# Patient Record
Sex: Female | Born: 1945 | Race: White | Hispanic: No | Smoking: Never smoker
Health system: Southern US, Community
[De-identification: ages and names within clinical notes are randomized; demographics above are authoritative.]

## PROBLEM LIST (undated history)

## (undated) DIAGNOSIS — M8580 Other specified disorders of bone density and structure, unspecified site: Secondary | ICD-10-CM

## (undated) DIAGNOSIS — C801 Malignant (primary) neoplasm, unspecified: Secondary | ICD-10-CM

## (undated) DIAGNOSIS — F32A Depression, unspecified: Secondary | ICD-10-CM

## (undated) DIAGNOSIS — E042 Nontoxic multinodular goiter: Secondary | ICD-10-CM

## (undated) DIAGNOSIS — K219 Gastro-esophageal reflux disease without esophagitis: Secondary | ICD-10-CM

## (undated) DIAGNOSIS — I10 Essential (primary) hypertension: Secondary | ICD-10-CM

## (undated) DIAGNOSIS — J40 Bronchitis, not specified as acute or chronic: Secondary | ICD-10-CM

## (undated) DIAGNOSIS — E7800 Pure hypercholesterolemia, unspecified: Secondary | ICD-10-CM

## (undated) DIAGNOSIS — F329 Major depressive disorder, single episode, unspecified: Secondary | ICD-10-CM

## (undated) DIAGNOSIS — IMO0002 Reserved for concepts with insufficient information to code with codable children: Secondary | ICD-10-CM

## (undated) DIAGNOSIS — Z9889 Other specified postprocedural states: Secondary | ICD-10-CM

## (undated) DIAGNOSIS — E049 Nontoxic goiter, unspecified: Secondary | ICD-10-CM

## (undated) DIAGNOSIS — R112 Nausea with vomiting, unspecified: Secondary | ICD-10-CM

## (undated) DIAGNOSIS — F419 Anxiety disorder, unspecified: Secondary | ICD-10-CM

## (undated) HISTORY — PX: APPENDECTOMY: SHX54

## (undated) HISTORY — DX: Other specified disorders of bone density and structure, unspecified site: M85.80

## (undated) HISTORY — DX: Pure hypercholesterolemia, unspecified: E78.00

## (undated) HISTORY — PX: BREAST LUMPECTOMY: SHX2

## (undated) HISTORY — DX: Major depressive disorder, single episode, unspecified: F32.9

## (undated) HISTORY — DX: Depression, unspecified: F32.A

## (undated) HISTORY — DX: Reserved for concepts with insufficient information to code with codable children: IMO0002

## (undated) HISTORY — PX: OTHER SURGICAL HISTORY: SHX169

## (undated) HISTORY — DX: Anxiety disorder, unspecified: F41.9

## (undated) HISTORY — DX: Essential (primary) hypertension: I10

## (undated) HISTORY — PX: TUBAL LIGATION: SHX77

## (undated) HISTORY — DX: Nontoxic multinodular goiter: E04.2

## (undated) HISTORY — DX: Gastro-esophageal reflux disease without esophagitis: K21.9

## (undated) HISTORY — DX: Nontoxic goiter, unspecified: E04.9

## (undated) HISTORY — PX: BREAST BIOPSY: SHX20

## (undated) HISTORY — DX: Bronchitis, not specified as acute or chronic: J40

---

## 2000-05-25 ENCOUNTER — Other Ambulatory Visit: Admission: RE | Admit: 2000-05-25 | Discharge: 2000-05-25 | Payer: Self-pay | Admitting: Family Medicine

## 2001-05-04 ENCOUNTER — Other Ambulatory Visit: Admission: RE | Admit: 2001-05-04 | Discharge: 2001-05-04 | Payer: Self-pay | Admitting: Family Medicine

## 2002-05-06 ENCOUNTER — Other Ambulatory Visit: Admission: RE | Admit: 2002-05-06 | Discharge: 2002-05-06 | Payer: Self-pay | Admitting: Family Medicine

## 2003-05-09 ENCOUNTER — Other Ambulatory Visit: Admission: RE | Admit: 2003-05-09 | Discharge: 2003-05-09 | Payer: Self-pay | Admitting: *Deleted

## 2003-11-14 ENCOUNTER — Encounter: Admission: RE | Admit: 2003-11-14 | Discharge: 2003-11-14 | Payer: Self-pay | Admitting: Orthopedic Surgery

## 2004-02-26 ENCOUNTER — Encounter: Admission: RE | Admit: 2004-02-26 | Discharge: 2004-02-26 | Payer: Self-pay | Admitting: Family Medicine

## 2005-06-20 ENCOUNTER — Other Ambulatory Visit: Admission: RE | Admit: 2005-06-20 | Discharge: 2005-06-20 | Payer: Self-pay | Admitting: Family Medicine

## 2006-11-22 ENCOUNTER — Other Ambulatory Visit: Admission: RE | Admit: 2006-11-22 | Discharge: 2006-11-22 | Payer: Self-pay | Admitting: Family Medicine

## 2007-02-14 ENCOUNTER — Emergency Department (HOSPITAL_COMMUNITY): Admission: EM | Admit: 2007-02-14 | Discharge: 2007-02-15 | Payer: Self-pay | Admitting: Emergency Medicine

## 2010-12-05 DIAGNOSIS — E049 Nontoxic goiter, unspecified: Secondary | ICD-10-CM

## 2010-12-05 HISTORY — DX: Nontoxic goiter, unspecified: E04.9

## 2011-01-11 ENCOUNTER — Other Ambulatory Visit: Payer: Self-pay | Admitting: Orthopedic Surgery

## 2011-01-11 DIAGNOSIS — M25512 Pain in left shoulder: Secondary | ICD-10-CM

## 2011-01-14 ENCOUNTER — Other Ambulatory Visit: Payer: Self-pay | Admitting: Family Medicine

## 2011-01-14 DIAGNOSIS — E041 Nontoxic single thyroid nodule: Secondary | ICD-10-CM

## 2011-01-17 ENCOUNTER — Ambulatory Visit
Admission: RE | Admit: 2011-01-17 | Discharge: 2011-01-17 | Disposition: A | Discharge status: Home / Self Care | Payer: BC Managed Care – PPO | Source: Ambulatory Visit | Attending: Family Medicine | Admitting: Family Medicine

## 2011-01-17 ENCOUNTER — Ambulatory Visit
Admission: RE | Admit: 2011-01-17 | Discharge: 2011-01-17 | Disposition: A | Discharge status: Home / Self Care | Payer: BC Managed Care – PPO | Source: Ambulatory Visit | Attending: Orthopedic Surgery | Admitting: Orthopedic Surgery

## 2011-01-17 DIAGNOSIS — M25512 Pain in left shoulder: Secondary | ICD-10-CM

## 2011-01-17 DIAGNOSIS — E041 Nontoxic single thyroid nodule: Secondary | ICD-10-CM

## 2011-01-20 ENCOUNTER — Other Ambulatory Visit: Payer: Self-pay | Admitting: Family Medicine

## 2011-01-20 DIAGNOSIS — E042 Nontoxic multinodular goiter: Secondary | ICD-10-CM

## 2011-01-26 ENCOUNTER — Other Ambulatory Visit: Payer: Self-pay | Admitting: Diagnostic Radiology

## 2011-01-26 ENCOUNTER — Ambulatory Visit
Admission: RE | Admit: 2011-01-26 | Discharge: 2011-01-26 | Disposition: A | Discharge status: Home / Self Care | Payer: BC Managed Care – PPO | Source: Ambulatory Visit | Attending: Family Medicine | Admitting: Family Medicine

## 2011-01-26 ENCOUNTER — Other Ambulatory Visit (HOSPITAL_COMMUNITY)
Admission: RE | Admit: 2011-01-26 | Discharge: 2011-01-26 | Disposition: A | Discharge status: Home / Self Care | Payer: BC Managed Care – PPO | Source: Ambulatory Visit | Attending: Diagnostic Radiology | Admitting: Diagnostic Radiology

## 2011-01-26 DIAGNOSIS — E042 Nontoxic multinodular goiter: Secondary | ICD-10-CM

## 2011-01-26 DIAGNOSIS — E049 Nontoxic goiter, unspecified: Secondary | ICD-10-CM | POA: Insufficient documentation

## 2011-01-27 ENCOUNTER — Other Ambulatory Visit: Payer: Self-pay | Admitting: Internal Medicine

## 2011-01-31 ENCOUNTER — Telehealth: Payer: Self-pay | Admitting: Emergency Medicine

## 2011-02-03 HISTORY — PX: OTHER SURGICAL HISTORY: SHX169

## 2011-06-30 NOTE — Telephone Encounter (Signed)
TELEPHONE NOTE 

## 2011-08-09 ENCOUNTER — Other Ambulatory Visit: Payer: Self-pay | Admitting: Endocrinology

## 2011-08-09 DIAGNOSIS — E049 Nontoxic goiter, unspecified: Secondary | ICD-10-CM

## 2011-08-22 ENCOUNTER — Ambulatory Visit
Admission: RE | Admit: 2011-08-22 | Discharge: 2011-08-22 | Disposition: A | Discharge status: Home / Self Care | Payer: Medicare Other | Source: Ambulatory Visit | Attending: Endocrinology | Admitting: Endocrinology

## 2011-08-22 DIAGNOSIS — E049 Nontoxic goiter, unspecified: Secondary | ICD-10-CM

## 2011-08-23 ENCOUNTER — Other Ambulatory Visit: Payer: BC Managed Care – PPO

## 2011-09-13 ENCOUNTER — Encounter (INDEPENDENT_AMBULATORY_CARE_PROVIDER_SITE_OTHER): Payer: Self-pay | Admitting: Surgery

## 2011-09-13 ENCOUNTER — Ambulatory Visit (INDEPENDENT_AMBULATORY_CARE_PROVIDER_SITE_OTHER): Payer: Medicare Other | Admitting: Surgery

## 2011-09-13 VITALS — BP 136/84 | HR 72 | Temp 97.8°F | Resp 16 | Ht 60.0 in | Wt 144.8 lb

## 2011-09-13 DIAGNOSIS — E042 Nontoxic multinodular goiter: Secondary | ICD-10-CM

## 2011-09-13 NOTE — Progress Notes (Signed)
Chief Complaint  Patient presents with  . Thyroid Nodule    Evaluate goiter with compressive symptoms - referral from Dr. Laurann Montana    HISTORY: Patient is a 65 year old female referred by her primary physician for evaluation of multinodular thyroid goiter with compressive symptoms. Patient was initially diagnosed in February 2012. She underwent thyroid ultrasound and also underwent bilateral fine-needle aspiration biopsies. Cytopathology was consistent with nonneoplastic goiter.  Patient has never been on thyroid medication. She has had no prior head or neck surgery. She did see an endocrinologist in consultation in September 2012.  Patient complains of compressive symptoms including constant globus sensation, dysphagia of both solids and liquids, and frequent hoarseness.  Patient has a family history of thyroid disease in her mother who underwent surgery for thyroid goiter. There is no family history of thyroid malignancy or other endocrinopathy.   Past Medical History  Diagnosis Date  . Asthma   . Bronchitis   . Hypertension   . Hypercholesteremia   . Allergic rhinitis   . Depression   . Osteopenia   . Multiple thyroid nodules   . DDD (degenerative disc disease)   . Anxiety   . Gastroesophageal reflux   . Goiter 2012     Current Outpatient Prescriptions  Medication Sig Dispense Refill  . albuterol (PROVENTIL) (2.5 MG/3ML) 0.083% nebulizer solution Take 2.5 mg by nebulization every 6 (six) hours as needed.        Marland Kitchen aspirin 81 MG tablet Take 81 mg by mouth daily.        . budesonide-formoterol (SYMBICORT) 80-4.5 MCG/ACT inhaler Inhale 2 puffs into the lungs 2 (two) times daily.        Marland Kitchen buPROPion (WELLBUTRIN XL) 300 MG 24 hr tablet Take 300 mg by mouth daily.        . calcium carbonate 200 MG capsule Take 150 mg by mouth 2 (two) times daily with a meal.        . calcium-vitamin D 250-100 MG-UNIT per tablet Take 1 tablet by mouth 2 (two) times daily.        . citalopram  (CELEXA) 40 MG tablet Take 40 mg by mouth daily.        Marland Kitchen HYDROcodone-acetaminophen (VICODIN) 5-500 MG per tablet Take 1 tablet by mouth every 6 (six) hours as needed.        . hydrOXYzine (ATARAX/VISTARIL) 25 MG tablet Take 25 mg by mouth 3 (three) times daily as needed.        Marland Kitchen ibuprofen (ADVIL,MOTRIN) 800 MG tablet Take 800 mg by mouth every 8 (eight) hours as needed.        Marland Kitchen lisinopril-hydrochlorothiazide (PRINZIDE,ZESTORETIC) 20-12.5 MG per tablet Take 1 tablet by mouth daily.        . montelukast (SINGULAIR) 10 MG tablet Take 10 mg by mouth at bedtime.        . Multiple Vitamins-Minerals (MULTIVITAMIN WITH MINERALS) tablet Take 1 tablet by mouth daily.        . pravastatin (PRAVACHOL) 40 MG tablet Take 40 mg by mouth daily.        . ranitidine (ZANTAC) 300 MG tablet Take 300 mg by mouth at bedtime.        . temazepam (RESTORIL) 30 MG capsule Take 30 mg by mouth at bedtime as needed.        . vitamin B-12 (CYANOCOBALAMIN) 100 MCG tablet Take 500 mcg by mouth daily.        . vitamin E 100 UNIT capsule Take  100 Units by mouth daily.        Marland Kitchen azelastine (ASTELIN) 137 MCG/SPRAY nasal spray Place 1 spray into the nose 2 (two) times daily. Use in each nostril as directed          Allergies  Allergen Reactions  . Darvon   . Ivp Dye (Iodinated Diagnostic Agents)   . Lipitor (Atorvastatin Calcium)   . Pravachol      Family History  Problem Relation Age of Onset  . Hypertension Father   . Diabetes Father   . Hyperlipidemia Father   . Cancer Father   . Heart attack Mother   . Hyperlipidemia Mother      History   Social History  . Marital Status: Divorced    Spouse Name: N/A    Number of Children: N/A  . Years of Education: N/A   Social History Main Topics  . Smoking status: Never Smoker   . Smokeless tobacco: None  . Alcohol Use: 0.6 oz/week    1 Glasses of wine per week  . Drug Use: No  . Sexually Active: None   Other Topics Concern  . None   Social History  Narrative  . None     REVIEW OF SYSTEMS - PERTINENT POSITIVES ONLY: Patient notes compressive symptoms including globus sensation, dysphagia, and hoarseness. She denies tremor. She denies palpitations.   EXAM: Filed Vitals:   09/13/11 1336  BP: 136/84  Pulse: 72  Temp: 97.8 F (36.6 C)  Resp: 16    HEENT: normocephalic; pupils equal and reactive; sclerae clear; dentition good; mucous membranes moist NECK:  Pelvis noted on neck extension on the right. Dominant nodule palpable in the right mid and lower lobe extending beneath the clavicle. Slight tracheal deviation to the left. Dominant nodule left thyroid lobe mobile and nontender. No anterior or posterior cervical lymphadenopathy. No supraclavicular masses.; asymmetric on extension; no palpable anterior or posterior cervical lymphadenopathy; no supraclavicular masses; no tenderness CHEST: clear to auscultation bilaterally without rales, rhonchi, or wheezes CARDIAC: regular rate and rhythm without significant murmur; peripheral pulses are full EXT:  non-tender without edema; no deformity NEURO: no gross focal deficits; no sign of tremor   LABORATORY RESULTS: See E-Chart for most recent results   RADIOLOGY RESULTS: See E-Chart or I-Site for most recent results   IMPRESSION: #1 multinodular thyroid goiter #2 moderate compressive symptoms   PLAN: I discussed at length with the patient the indications for thyroidectomy. I explained to her that there was no absolute indication for thyroidectomy at this point, but based on her progressive compressive symptoms, she may want to consider thyroidectomy. Risk and benefits were discussed at length with the patient including risk of injury to parathyroid glands and risk of injury to laryngeal nerves. We discussed the hospital stay to be anticipated and her recovery following surgery. Patient understands and agrees to proceed. We will make arrangements at a time convenient for the  patient.  The risks and benefits of the procedure have been discussed at length with the patient.  The patient understands the proposed procedure, potential alternative treatments, and the course of recovery to be expected.  All of the patient's questions have been answered at this time.  The patient wishes to proceed with surgery and will schedule a date for their procedure through our office staff.   Velora Heckler, MD, FACS General & Endocrine Surgery [facility]      Visit Diagnoses: 1. Multinodular goiter (nontoxic), with compressive symptoms  Primary Care Physician: Cala Bradford, MD  Endocrinologist: Reather Littler, MD.

## 2011-09-28 ENCOUNTER — Other Ambulatory Visit (INDEPENDENT_AMBULATORY_CARE_PROVIDER_SITE_OTHER): Payer: Self-pay | Admitting: Surgery

## 2011-09-28 ENCOUNTER — Encounter (HOSPITAL_COMMUNITY): Payer: Medicare HMO

## 2011-09-28 ENCOUNTER — Ambulatory Visit (HOSPITAL_COMMUNITY)
Admission: RE | Admit: 2011-09-28 | Discharge: 2011-09-28 | Disposition: A | Discharge status: Home / Self Care | Payer: Medicare HMO | Source: Ambulatory Visit | Attending: Surgery | Admitting: Surgery

## 2011-09-28 DIAGNOSIS — R0602 Shortness of breath: Secondary | ICD-10-CM | POA: Insufficient documentation

## 2011-09-28 DIAGNOSIS — E041 Nontoxic single thyroid nodule: Secondary | ICD-10-CM | POA: Insufficient documentation

## 2011-09-28 DIAGNOSIS — E049 Nontoxic goiter, unspecified: Secondary | ICD-10-CM

## 2011-09-28 DIAGNOSIS — R05 Cough: Secondary | ICD-10-CM | POA: Insufficient documentation

## 2011-09-28 DIAGNOSIS — Z01818 Encounter for other preprocedural examination: Secondary | ICD-10-CM | POA: Insufficient documentation

## 2011-09-28 DIAGNOSIS — J45909 Unspecified asthma, uncomplicated: Secondary | ICD-10-CM | POA: Insufficient documentation

## 2011-09-28 DIAGNOSIS — R059 Cough, unspecified: Secondary | ICD-10-CM | POA: Insufficient documentation

## 2011-09-28 DIAGNOSIS — Z01812 Encounter for preprocedural laboratory examination: Secondary | ICD-10-CM | POA: Insufficient documentation

## 2011-09-28 LAB — URINALYSIS, ROUTINE W REFLEX MICROSCOPIC
Bilirubin Urine: NEGATIVE
Glucose, UA: NEGATIVE mg/dL
Ketones, ur: NEGATIVE mg/dL
pH: 7.5 (ref 5.0–8.0)

## 2011-09-28 LAB — SURGICAL PCR SCREEN: Staphylococcus aureus: NEGATIVE

## 2011-09-28 LAB — CBC
HCT: 45.2 % (ref 36.0–46.0)
MCH: 30.6 pg (ref 26.0–34.0)
MCHC: 32.7 g/dL (ref 30.0–36.0)
MCV: 93.4 fL (ref 78.0–100.0)
RDW: 12.9 % (ref 11.5–15.5)

## 2011-09-28 LAB — DIFFERENTIAL
Eosinophils Relative: 1 % (ref 0–5)
Lymphocytes Relative: 33 % (ref 12–46)
Lymphs Abs: 2.3 10*3/uL (ref 0.7–4.0)
Monocytes Absolute: 0.4 10*3/uL (ref 0.1–1.0)
Monocytes Relative: 6 % (ref 3–12)

## 2011-09-28 LAB — BASIC METABOLIC PANEL
CO2: 27 mEq/L (ref 19–32)
Calcium: 10.1 mg/dL (ref 8.4–10.5)
Creatinine, Ser: 0.83 mg/dL (ref 0.50–1.10)
Glucose, Bld: 105 mg/dL — ABNORMAL HIGH (ref 70–99)
Sodium: 142 mEq/L (ref 135–145)

## 2011-10-03 ENCOUNTER — Telehealth (INDEPENDENT_AMBULATORY_CARE_PROVIDER_SITE_OTHER): Payer: Self-pay

## 2011-10-03 NOTE — Progress Notes (Signed)
Faxed to [place] 

## 2011-10-03 NOTE — Progress Notes (Signed)
Quick Note:  These results are acceptable for scheduled surgery. TMG ______ 

## 2011-10-06 ENCOUNTER — Ambulatory Visit (HOSPITAL_COMMUNITY)
Admission: RE | Admit: 2011-10-06 | Discharge: 2011-10-07 | Disposition: A | Discharge status: Home / Self Care | Payer: Medicare HMO | Source: Ambulatory Visit | Attending: Surgery | Admitting: Surgery

## 2011-10-06 ENCOUNTER — Other Ambulatory Visit (INDEPENDENT_AMBULATORY_CARE_PROVIDER_SITE_OTHER): Payer: Self-pay | Admitting: Surgery

## 2011-10-06 DIAGNOSIS — E042 Nontoxic multinodular goiter: Secondary | ICD-10-CM | POA: Insufficient documentation

## 2011-10-06 DIAGNOSIS — Z01812 Encounter for preprocedural laboratory examination: Secondary | ICD-10-CM | POA: Insufficient documentation

## 2011-10-06 DIAGNOSIS — I10 Essential (primary) hypertension: Secondary | ICD-10-CM | POA: Insufficient documentation

## 2011-10-06 DIAGNOSIS — E063 Autoimmune thyroiditis: Secondary | ICD-10-CM | POA: Insufficient documentation

## 2011-10-06 DIAGNOSIS — Z01818 Encounter for other preprocedural examination: Secondary | ICD-10-CM | POA: Insufficient documentation

## 2011-10-06 DIAGNOSIS — J45909 Unspecified asthma, uncomplicated: Secondary | ICD-10-CM | POA: Insufficient documentation

## 2011-10-06 HISTORY — PX: TOTAL THYROIDECTOMY: SHX2547

## 2011-10-07 LAB — CALCIUM: Calcium: 8.4 mg/dL (ref 8.4–10.5)

## 2011-10-10 NOTE — Progress Notes (Signed)
Quick Note:  Please contact patient with benign path results. TMG ______ 

## 2011-10-10 NOTE — Progress Notes (Signed)
Patient aware of path.

## 2011-10-13 NOTE — Discharge Summary (Signed)
  Kristin Ball, Kristin Ball              ACCOUNT NO.:  [id number]  MEDICAL RECORD NO.:  [id number]  LOCATION:  1301                         FACILITY:  [facility]  PHYSICIAN:  Velora Heckler, MD      DATE OF BIRTH:  1946/01/15  DATE OF ADMISSION:  10/06/2011 DATE OF DISCHARGE:  10/07/2011                              DISCHARGE SUMMARY   REASON FOR ADMISSION:  Multinodular thyroid goiter with compressive symptoms.  BRIEF HISTORY:  Patient is a 65 year old white female with longstanding multinodular goiter.  She has developed mild to moderate compressive symptoms.  She desires thyroidectomy.  HOSPITAL COURSE:  Patient was admitted on October 06, 2011 and taken directly to the operating room.  She underwent total thyroidectomy without complication.  Postoperatively, her serum calcium level was 8.8 on the evening of surgery and 8.4 on the morning following surgery. Patient tolerated a regular diet.  She was prepared for discharge home on the first postoperative day.  DISCHARGE PLAN:  Patient is discharged home today, October 07, 2011, in good condition, tolerating a regular diet, and ambulating independently.  DISCHARGE MEDICATIONS:  Include: 1. Vicodin as needed for pain. 2. Synthroid 88 mcg daily. 3. Patient will take calcium carbonate tablets, 2 tablets 3 times     daily. She will return to my office in 3 weeks for wound check.  We will check a calcium level prior to that office visit.  FINAL DIAGNOSIS:  Multinodular thyroid goiter with compressive symptoms, final pathologic results pending at the time of discharge.  CONDITION AT DISCHARGE:  Good.     Velora Heckler, MD     TMG/MEDQ  D:  10/07/2011  T:  10/07/2011  Job:  [id number]  cc:   Stacie Acres. Cliffton Asters, M.D. Fax: [phone]  Reather Littler, M.D. Fax: [phone]  Electronically Signed by Darnell Level MD on 10/13/2011 10:29:31 AM

## 2011-10-13 NOTE — Op Note (Signed)
Kristin Ball, Kristin Ball              ACCOUNT NO.:  [id number]  MEDICAL RECORD NO.:  [id number]  LOCATION:  1301                         FACILITY:  [facility]  PHYSICIAN:  Velora Heckler, MD      DATE OF BIRTH:  Oct 07, 1946  DATE OF PROCEDURE:  10/06/2011                               OPERATIVE REPORT   PREOPERATIVE DIAGNOSIS:  Multinodular thyroid goiter with compressive symptoms.  POSTOPERATIVE DIAGNOSIS:  Multinodular thyroid goiter with compressive symptoms.  PROCEDURE:  Total thyroidectomy.  SURGEON:  Velora Heckler, M.D., FACS  ASSISTANT:  Anselm Pancoast. Zachery Dakins, M.D., FACS  ANESTHESIA:  General per Dr. Helane Rima.  ESTIMATED BLOOD LOSS:  Minimal.  PREPARATION:  ChloraPrep.  COMPLICATIONS:  None.  INDICATIONS:  Patient is a 65 year old female referred by her primary physician for multinodular goiter with compressive symptoms.  Patient had originally been diagnosed in February 2012.  Ultrasound showed bilateral thyroid nodules.  Bilateral fine-needle aspiration biopsies were performed which showed findings consistent with non neoplastic goiter.  Patient complained of a globus sensation, dysphagia, and frequent hoarseness.  She now comes to Surgery for thyroidectomy.  BODY OF REPORT:  Procedure was done in OR #6 at the [facility].  Patient was brought to the operating room and placed in a supine position on the operating room table.  Following administration of general anesthesia, the patient was positioned and then prepped and draped in the usual strict aseptic fashion.  After ascertaining that an adequate level of anesthesia had been achieved, a Kocher incision was made with a #15 blade.  Dissection was carried through subcutaneous tissues and platysma.  Hemostasis was obtained with the electrocautery.  Skin flaps were elevated cephalad and caudad from the thyroid notch to the sternal notch.  A Mahorner self-retaining retractors placed for  exposure.  Strap muscles were incised in the midline and dissection was begun on the left side.  There is approximately a 3 cm nodule occupying the mid and lower portion of the left thyroid lobe.  Lobe was gently mobilized.  Venous tributaries were divided between Ligaclips with the Harmonic Scalpel.  Superior pole vessels were dissected out and individually divided between Ligaclips with the Harmonic Scalpel.  Parathyroid tissue was identified and preserved in the superior position.  Likewise, the parathyroid gland was identified on the inferior pole on the left.  It was dissected off and preserved on its vascular pedicle.  Venous tributaries to the inferior pole were divided between Ligaclips with the Harmonic Scalpel.  Gland was rolled anteriorly.  Branches of the inferior thyroid artery were divided between small Ligaclips with the Harmonic Scalpel.  Recurrent nerve was identified and preserved.  Ligament of Allyson Sabal was released with the electrocautery and the gland was mobilized up and onto the anterior trachea.  Isthmus was mobilized across the midline.  There is no significant pyramidal lobe identified.  Dry pack was placed in the left neck.  Next, we turned our attention to the right thyroid lobe.  Again, strap muscles were reflected laterally.  There was a large nodule involving the right upper pole.  This was gently mobilized.  Superior pole vessels were divided individually between Ligaclips with the Harmonic  Scalpel. Gland was rolled anteriorly and middle thyroid vein was divided between Ligaclips with the Harmonic Scalpel.  Inferior venous tributaries were also divided between Ligaclips with the Harmonic Scalpel.  Gland was rolled further anteriorly.  Superior parathyroid gland was identified and preserved on its vascular pedicle.  Branches of the inferior thyroid artery were divided between small Ligaclips with the Harmonic Scalpel, taking care to avoid the recurrent  laryngeal nerve.  Ligament of Allyson Sabal was released and the gland was mobilized onto the anterior trachea from which it was completely excised with the electrocautery.  Sutures used to mark the right superior pole.  The entire thyroid gland was submitted to Pathology for review.  Neck was irrigated bilaterally with warm saline.  Surgicel was placed in the operative field bilaterally.  Strap muscles were reapproximated in the midline with interrupted 3-0 Vicryl sutures.  Platysma was closed with interrupted 3-0 Vicryl sutures.  Skin was closed with a running 4-0 Monocryl subcuticular suture.  Wound was washed and dried and benzoin and Steri-Strips were applied.  Sterile dressings were applied.  Patient was awakened from anesthesia and brought to the recovery room.  The patient tolerated the procedure well.   Velora Heckler, MD, FACS     TMG/MEDQ  D:  10/06/2011  T:  10/07/2011  Job:  [id number]  cc:   Stacie Acres. Cliffton Asters, M.D. Fax: [phone]  Reather Littler, M.D. Fax: [phone]  Electronically Signed by Darnell Level MD on 10/13/2011 10:29:25 AM

## 2011-10-19 ENCOUNTER — Other Ambulatory Visit (INDEPENDENT_AMBULATORY_CARE_PROVIDER_SITE_OTHER): Payer: Self-pay | Admitting: Surgery

## 2011-10-19 DIAGNOSIS — Z9089 Acquired absence of other organs: Secondary | ICD-10-CM

## 2011-10-19 DIAGNOSIS — Z9889 Other specified postprocedural states: Secondary | ICD-10-CM

## 2011-10-21 NOTE — Telephone Encounter (Signed)
done

## 2011-10-25 ENCOUNTER — Other Ambulatory Visit (INDEPENDENT_AMBULATORY_CARE_PROVIDER_SITE_OTHER): Payer: Self-pay | Admitting: Surgery

## 2011-10-25 LAB — CALCIUM: Calcium: 10.4 mg/dL (ref 8.4–10.5)

## 2011-11-02 ENCOUNTER — Encounter (INDEPENDENT_AMBULATORY_CARE_PROVIDER_SITE_OTHER): Payer: Medicare HMO | Admitting: Surgery

## 2011-11-03 ENCOUNTER — Encounter (INDEPENDENT_AMBULATORY_CARE_PROVIDER_SITE_OTHER): Payer: Self-pay | Admitting: Surgery

## 2011-11-03 ENCOUNTER — Ambulatory Visit (INDEPENDENT_AMBULATORY_CARE_PROVIDER_SITE_OTHER): Payer: Medicare HMO | Admitting: Surgery

## 2011-11-03 ENCOUNTER — Encounter (INDEPENDENT_AMBULATORY_CARE_PROVIDER_SITE_OTHER): Payer: Self-pay

## 2011-11-03 VITALS — BP 138/84 | HR 60 | Temp 97.7°F | Resp 16 | Ht 60.0 in | Wt 143.2 lb

## 2011-11-03 DIAGNOSIS — Z9889 Other specified postprocedural states: Secondary | ICD-10-CM

## 2011-11-03 DIAGNOSIS — E042 Nontoxic multinodular goiter: Secondary | ICD-10-CM

## 2011-11-03 NOTE — Progress Notes (Signed)
Visit Diagnoses: 1. S/P thyroidectomy   2. Multinodular goiter (nontoxic), with compressive symptoms     HISTORY: Patient presents for her first postoperative visit having undergone total thyroidectomy on October 06, 2011. Final pathology shows nodular hyperplasia and chronic lymphocytic thyroiditis. No evidence of malignancy was identified. Patient is taking Synthroid 88 mcg daily.  EXAM: Surgical wound has healed nicely. Mild soft tissue swelling. Remaining Steri-Strips are removed in the office today. Voice quality is moderately hoarse.  IMPRESSION: Status post total thyroidectomy for chronic lymphocytic thyroiditis and nodular hyperplasia with compressive symptoms  PLAN: Patient will begin applying topical creams to her incision. We will check a TSH level today and adjust her thyroid hormone dosage if necessary. She will return to see me for a final wound check in 6 weeks. We will reassess her voice quality at that time.   Velora Heckler, MD, FACS General & Endocrine Surgery [facility]

## 2011-11-03 NOTE — Patient Instructions (Signed)
  COCOA BUTTER & VITAMIN E CREAM  (Palmer's or other brand)  Apply cocoa butter/vitamin E cream to your incision 2 - 3 times daily.  Massage cream into incision for one minute with each application.  Use sunscreen (50 SPF or higher) for first 6 months after surgery.  You may substitute Mederma or other scar reducing creams as desired.   

## 2011-12-16 ENCOUNTER — Encounter (INDEPENDENT_AMBULATORY_CARE_PROVIDER_SITE_OTHER): Payer: Self-pay | Admitting: Surgery

## 2011-12-19 ENCOUNTER — Ambulatory Visit (INDEPENDENT_AMBULATORY_CARE_PROVIDER_SITE_OTHER): Payer: Medicare HMO | Admitting: Surgery

## 2011-12-19 ENCOUNTER — Encounter (INDEPENDENT_AMBULATORY_CARE_PROVIDER_SITE_OTHER): Payer: Self-pay | Admitting: Surgery

## 2011-12-19 DIAGNOSIS — E890 Postprocedural hypothyroidism: Secondary | ICD-10-CM

## 2011-12-19 DIAGNOSIS — E042 Nontoxic multinodular goiter: Secondary | ICD-10-CM

## 2011-12-19 NOTE — Patient Instructions (Signed)
  COCOA BUTTER & VITAMIN E CREAM  (Palmer's or other brand)  Apply cocoa butter/vitamin E cream to your incision 2 - 3 times daily.  Massage cream into incision for one minute with each application.  Use sunscreen (50 SPF or higher) for first 6 months after surgery.  You may substitute Mederma or other scar reducing creams as desired.   

## 2011-12-19 NOTE — Progress Notes (Signed)
Visit Diagnoses: 1. Multinodular goiter (nontoxic), with compressive symptoms   2. Hypothyroidism, postsurgical     HISTORY: The patient returns for followup having undergone total thyroidectomy in early November. She is on Synthroid 88 mcg daily and her TSH level at the end of November was within the desired range.  Patient continues to note some weakness of voice and occasional hoarseness. She does note that her voice strength has improved. She is working. She does not have shortness of breath.  EXAM: Surgical incision is well-healed. Mild induration. Mild erythema. No palpable masses. No seroma.  IMPRESSION: Status post total thyroidectomy for multinodular goiter with compressive symptoms  PLAN: Patient and I discussed her voice quality. It is definitely improving. I offered to send her to ENT for direct laryngoscopy. Patient would like to postpone that evaluation for another 3 months. I will see her back at that time to reassess her voice quality and reexamine her neck. If she has persistent voice quality problems then we will asked for direct laryngoscopy to determine the etiology.  Patient will see her primary care physician in March and have laboratory studies drawn at that time. We will ask for a TSH level determination.  Patient will return to see me in 3 months.  Velora Heckler, MD, FACS General & Endocrine Surgery [facility]

## 2012-03-22 ENCOUNTER — Other Ambulatory Visit (INDEPENDENT_AMBULATORY_CARE_PROVIDER_SITE_OTHER): Payer: Self-pay | Admitting: Surgery

## 2012-03-22 ENCOUNTER — Ambulatory Visit (INDEPENDENT_AMBULATORY_CARE_PROVIDER_SITE_OTHER): Payer: BC Managed Care – PPO | Admitting: Surgery

## 2012-03-22 ENCOUNTER — Encounter (INDEPENDENT_AMBULATORY_CARE_PROVIDER_SITE_OTHER): Payer: Self-pay | Admitting: Surgery

## 2012-03-22 VITALS — BP 136/82 | HR 74 | Temp 97.4°F | Resp 18 | Ht 60.0 in | Wt 148.4 lb

## 2012-03-22 DIAGNOSIS — R499 Unspecified voice and resonance disorder: Secondary | ICD-10-CM

## 2012-03-22 DIAGNOSIS — E890 Postprocedural hypothyroidism: Secondary | ICD-10-CM

## 2012-03-22 NOTE — Patient Instructions (Signed)
Will arrange ENT consultation.  tmg

## 2012-03-22 NOTE — Progress Notes (Signed)
Visit Diagnoses: 1. Hypothyroidism, postsurgical     HISTORY: Patient is a 66 year old white female who underwent total thyroidectomy for multinodular goiter. She is taking Synthroid 88 mcg daily. Her TSH level is normal at 2.35. This is followed by her primary care physician.  Patient returns at this time for assessment of her voice quality. Patient had had various issues prior to surgery. She continues to have a variety of complaints related to her face. At times her conversational voice quality is normal. At other times she has significant hoarseness. At other times she has complete loss of voice. This is difficult to explain based on her recent surgery.  EXAM: Surgical incision is well-healed. No palpable masses. No tenderness. Voice quality is largely normal at conversational level.  IMPRESSION: #1 status post multinodular thyroid goiter #2 post surgical hypothyroidism #3 voice quality changes  PLAN: As we had discussed previously, I am going past the patient to be seen by ENT for direct laryngoscopy to evaluate her voice quality changes. We will make arrangements for this consultation in the near future.  Velora Heckler, MD, FACS General & Endocrine Surgery [facility]

## 2012-04-16 ENCOUNTER — Encounter (INDEPENDENT_AMBULATORY_CARE_PROVIDER_SITE_OTHER): Payer: Self-pay

## 2014-08-06 ENCOUNTER — Other Ambulatory Visit (HOSPITAL_COMMUNITY)
Admission: RE | Admit: 2014-08-06 | Discharge: 2014-08-06 | Disposition: A | Discharge status: Home / Self Care | Payer: Medicare Other | Source: Ambulatory Visit | Attending: Family Medicine | Admitting: Family Medicine

## 2014-08-06 ENCOUNTER — Other Ambulatory Visit: Payer: Self-pay | Admitting: Family Medicine

## 2014-08-06 DIAGNOSIS — Z124 Encounter for screening for malignant neoplasm of cervix: Secondary | ICD-10-CM | POA: Diagnosis present

## 2014-08-07 LAB — CYTOLOGY - PAP

## 2015-02-26 ENCOUNTER — Ambulatory Visit (INDEPENDENT_AMBULATORY_CARE_PROVIDER_SITE_OTHER): Payer: Medicare Other | Admitting: Internal Medicine

## 2015-02-26 ENCOUNTER — Encounter: Payer: Self-pay | Admitting: Internal Medicine

## 2015-02-26 ENCOUNTER — Ambulatory Visit (INDEPENDENT_AMBULATORY_CARE_PROVIDER_SITE_OTHER)
Admission: RE | Admit: 2015-02-26 | Discharge: 2015-02-26 | Disposition: A | Discharge status: Home / Self Care | Payer: Medicare Other | Source: Ambulatory Visit | Attending: Internal Medicine | Admitting: Internal Medicine

## 2015-02-26 ENCOUNTER — Encounter (INDEPENDENT_AMBULATORY_CARE_PROVIDER_SITE_OTHER): Payer: Self-pay

## 2015-02-26 VITALS — BP 142/80 | HR 80 | Ht 61.0 in | Wt 153.0 lb

## 2015-02-26 DIAGNOSIS — R0600 Dyspnea, unspecified: Secondary | ICD-10-CM | POA: Diagnosis not present

## 2015-02-26 DIAGNOSIS — R0609 Other forms of dyspnea: Secondary | ICD-10-CM

## 2015-02-26 MED ORDER — MOMETASONE FURO-FORMOTEROL FUM 100-5 MCG/ACT IN AERO: INHALATION_SPRAY | RESPIRATORY_TRACT | Status: DC

## 2015-02-26 MED ORDER — PANTOPRAZOLE SODIUM 40 MG PO TBEC: 40.0000 mg | DELAYED_RELEASE_TABLET | Freq: Every day | ORAL | Status: DC

## 2015-02-26 MED ORDER — FAMOTIDINE 20 MG PO TABS: ORAL_TABLET | ORAL | Status: AC

## 2015-02-26 NOTE — Patient Instructions (Addendum)
Omeprazole 20 x 2 or Pantoprazole (protonix) 40 mg   Take 30-60 min before first meal of the day and Pepcid 20 mg one bedtime until return to office - this is the best way to tell whether stomach acid is contributing to your problem.    GERD (REFLUX)  is an extremely common cause of respiratory symptoms just like yours , many times with no obvious heartburn at all.    It can be treated with medication, but also with lifestyle changes including avoidance of late meals, excessive alcohol, smoking cessation, and avoid fatty foods, chocolate, peppermint, colas, red wine, and acidic juices such as orange juice.  NO MINT OR MENTHOL PRODUCTS SO NO COUGH DROPS  USE SUGARLESS CANDY INSTEAD (Jolley ranchers or Stover's or Life Savers) or even ice chips will also do - the key is to swallow to prevent all throat clearing. NO OIL BASED VITAMINS - use powdered substitutes.    Change to dulera Take 2 puffs first thing in am and then another 2 puffs about 12 hours later.   Only use your albuterol as a rescue medication to be used if you can't catch your breath by resting or doing a relaxed purse lip breathing pattern.  - The less you use it, the better it will work when you need it. - Ok to use up to 2 puffs  every 4 hours if you must but call for immediate appointment if use goes up over your usual need - Don't leave home without it !!  (think of it like the spare tire for your car)    Please remember to go to the x-ray department downstairs for your tests - we will call you with the results when they are available.  Please schedule a follow up office visit in 6 weeks, call sooner if needed with pfts

## 2015-02-26 NOTE — Assessment & Plan Note (Addendum)
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 DDX of  difficult airways management all start with A and  include Adherence, Ace Inhibitors, Acid Reflux, Active Sinus Disease, Alpha 1 Antitripsin deficiency, Anxiety masquerading as Airways dz,  ABPA,  allergy(esp in young), Aspiration (esp in elderly), Adverse effects of DPI,  Active smokers, plus two Bs  = Bronchiectasis and Beta blocker use..and one C= CHF   Adherence is always the initial "prime suspect" and is a multilayered concern that requires a "trust but verify" approach in every patient - starting with knowing how to use medications, especially inhalers, correctly, keeping up with refills and understanding the fundamental difference between maintenance and prns vs those medications only taken for a very short course and then stopped and not refilled.  - The proper method of use, as well as anticipated side effects, of a metered-dose inhaler are discussed and demonstrated to the patient. Improved effectiveness after extensive coaching during this visit to a level of approximately  75% so try reducing ics to dulera 100 2bid   ? Acid (or non-acid) GERD > always difficult to exclude as up to 75% of pts in some series report no assoc GI/ Heartburn symptoms> rec max (24h)  acid suppression and diet restrictions/ reviewed and instructions given in writing.   ? Anxiety > dx of exclusion   Acei intol already documented 3 y ago.  Need to keep in mind  For reasons that may related to vascular permability and nitric oxide pathways but not elevated  bradykinin levels (as seen with  ACEi use) losartan in the generic form has been reported now from mulitple sources  to cause a similar pattern of non-specific  upper  airway symptoms as seen with acei.   This has not been reported with exposure to the other ARB's to date, so it seems reasonable for now to try either generic diovan or avapro if ARB needed or use an alternative class altogether.  See:  Lelon Frohlich Allergy Asthma Immunol  2008: 101: p 495-499    Would like to repeat the pfts p aggressive rx for gerd with max acid suppression/ diet .    Each maintenance medication was reviewed in detail including most importantly the difference between maintenance and as needed and under what circumstances the prns are to be used.  Please see instructions for details which were reviewed in writing and the patient given a copy.

## 2015-02-26 NOTE — Progress Notes (Signed)
Subjective:    Patient ID: Kristin Ball, female    DOB: 17-Aug-1946,    MRN: [id number]  HPI   22 yowf never smoker remembers persitent symptoms dating back to childhood esp spring fall itching sneezing coughing some wheezing s dx or treatment of any kind until age 69 at [facility] (husband was marine) dx asthma better on saba/ eval by Dr Bernita Buffy dx as cat allergy/n/a did not recommend shots rx [id number] [name] and referred to pulmonary clinic  by Dr Dema Severin for abn pfts.   02/26/2015 1st [facility] office visit/ Danira Nylander   Chief Complaint  Patient presents with  . Pulmonary Consult    Referred by Dr. Harlan Stains. Pt states dxed with asthma age 11. She states that her breathing has been bothering her more for the past 2 yrs. She states that she gets SOB mainly with exertion "strenuos things", walking fast and walking up 1 flight of stairs. She has occ cough- non prod but "feel mucus in my throat".  She uses proair on average 2-3 times per day.   over the last few years decrease in activity tolerance and more need for albuterol  Gradual trend  Thyroid removed 2012  Would like do nature walks but not able to do, esp hills/ x 2 years, some better with saba  Prev intol to acei Has gerd only uses prn hb  No obvious   day to day or daytime variabilty or assoc chronic cough or cp or chest tightness, subjective wheeze overt sinus or hb symptoms. No unusual exp hx or h/o childhood pna/ asthma or knowledge of premature birth.  Sleeping ok without nocturnal  or early am exacerbation  of respiratory  c/o's or need for noct saba. Also denies any obvious fluctuation of symptoms with weather or environmental changes or other aggravating or alleviating factors except as outlined above   Current Medications, Allergies, Complete Past Medical History, Past Surgical History, Family History, and Social History were reviewed in [employer] record.                  Review of Systems  Constitutional: Negative for fever, chills and unexpected weight change.  HENT: Positive for congestion and sneezing. Negative for dental problem, ear pain, nosebleeds, postnasal drip, rhinorrhea, sinus pressure, sore throat, trouble swallowing and voice change.   Eyes: Negative for visual disturbance.  Respiratory: Positive for shortness of breath. Negative for cough and choking.   Cardiovascular: Negative for chest pain and leg swelling.  Gastrointestinal: Negative for vomiting, abdominal pain and diarrhea.  Genitourinary: Negative for difficulty urinating.  Musculoskeletal: Negative for arthralgias.  Skin: Negative for rash.  Neurological: Negative for tremors, syncope and headaches.  Hematological: Does not bruise/bleed easily.       Objective:   Physical Exam  amb slt hoarse wf nad  Wt Readings from Last 3 Encounters:  02/26/15 153 lb (69.4 kg)  03/22/12 148 lb 6.4 oz (67.314 kg)  12/19/11 146 lb 12.8 oz (66.588 kg)    Vital signs reviewed  HEENT: nl dentition, turbinates, and orophanx. Nl external ear canals without cough reflex   NECK :  without JVD/Nodes/TM/ nl carotid upstrokes bilaterally   LUNGS: no acc muscle use, clear to A and P bilaterally without cough on insp or exp maneuvers   CV:  RRR  no s3 or murmur or increase in P2, no edema   ABD:  soft and nontender with nl excursion in the supine position. No  bruits or organomegaly, bowel sounds nl  MS:  warm without deformities, calf tenderness, cyanosis or clubbing  SKIN: warm and dry without lesions    NEURO:  alert, approp, no deficits    CXR PA and Lateral:   02/26/2015 :     I personally reviewed images and agree with radiology impression as follows:      No acute cardiopulmonary disease.        Assessment & Plan:

## 2015-04-09 ENCOUNTER — Ambulatory Visit (INDEPENDENT_AMBULATORY_CARE_PROVIDER_SITE_OTHER): Payer: Medicare Other | Admitting: Internal Medicine

## 2015-04-09 ENCOUNTER — Encounter: Payer: Self-pay | Admitting: Internal Medicine

## 2015-04-09 VITALS — BP 126/74 | HR 87 | Ht 60.0 in | Wt 157.0 lb

## 2015-04-09 DIAGNOSIS — J45991 Cough variant asthma: Secondary | ICD-10-CM

## 2015-04-09 DIAGNOSIS — R0600 Dyspnea, unspecified: Secondary | ICD-10-CM

## 2015-04-09 LAB — PULMONARY FUNCTION TEST
DL/VA % PRED: 120 %
DL/VA: 5.11 ml/min/mmHg/L
DLCO UNC: 18.12 ml/min/mmHg
DLCO unc % pred: 96 %
FEF 25-75 POST: 2.43 L/s
FEF 25-75 PRE: 2.04 L/s
FEF2575-%CHANGE-POST: 19 %
FEF2575-%PRED-POST: 141 %
FEF2575-%Pred-Pre: 118 %
FEV1-%Change-Post: 2 %
FEV1-%PRED-PRE: 94 %
FEV1-%Pred-Post: 96 %
FEV1-POST: 1.86 L
FEV1-PRE: 1.82 L
FEV1FVC-%CHANGE-POST: 7 %
FEV1FVC-%Pred-Pre: 109 %
FEV6-%CHANGE-POST: -4 %
FEV6-%PRED-POST: 85 %
FEV6-%Pred-Pre: 89 %
FEV6-PRE: 2.18 L
FEV6-Post: 2.08 L
FEV6FVC-%PRED-POST: 105 %
FEV6FVC-%Pred-Pre: 105 %
FVC-%CHANGE-POST: -4 %
FVC-%PRED-POST: 81 %
FVC-%Pred-Pre: 85 %
FVC-POST: 2.08 L
FVC-Pre: 2.18 L
POST FEV1/FVC RATIO: 89 %
PRE FEV1/FVC RATIO: 83 %
Post FEV6/FVC ratio: 100 %
Pre FEV6/FVC Ratio: 100 %
RV % pred: 87 %
RV: 1.72 L
TLC % pred: 96 %
TLC: 4.28 L

## 2015-04-09 MED ORDER — BUDESONIDE-FORMOTEROL FUMARATE 80-4.5 MCG/ACT IN AERO: INHALATION_SPRAY | RESPIRATORY_TRACT | Status: DC

## 2015-04-09 NOTE — Progress Notes (Signed)
Subjective:    Patient ID: Kristin Ball, female    DOB: Dec 31, 1945,    MRN: [id number]    Brief patient profile:  69 yowf never smoker remembers persitent symptoms dating back to childhood esp spring fall itching sneezing coughing some wheezing s dx or treatment of any kind until age 69 at [facility] (husband was marine) dx asthma better on saba/ eval by Kristin Ball dx as cat allergy/n/a did not recommend shots rx [email or web address] [name] and referred to pulmonary clinic  by Kristin Ball for abn office spirometry but completely nl pfts 04/09/2015 s am dulera.    History of Present Illness  02/26/2015 1st [facility] office visit/ Kristin Ball   Chief Complaint  Patient presents with  . Pulmonary Consult    Referred by Kristin. Harlan Ball. Pt states dxed with asthma age 69. She states that her breathing has been bothering her more for the past 2 yrs. She states that she gets SOB mainly with exertion "strenuos things", walking fast and walking up 1 flight of stairs. She has occ cough- non prod but "feel mucus in my throat".  She uses proair on average 2-3 times per day.   over the last few years decrease in activity tolerance and more need for albuterol  Gradual trend  Thyroid removed 2012  Would like do nature walks but not able to do, esp hills/ x 2 years, some better with saba  Prev intol to acei Has gerd only uses prn hb rec Omeprazole 20 x 2 or Pantoprazole (protonix) 40 mg   Take 30-60 min before first meal of the day and Pepcid 20 mg one bedtime until return to office - this is the best way to tell whether stomach acid is contributing to your problem.   GERD  Change to dulera 100 Take 2 puffs first thing in am and then another 2 puffs about 12 hours later.  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 Follow-up    PFT done today. Pt states breathing seems better with [facility], but she  is unable to afford med. Cough only bothers her every now and then.  She has only used albuterol x 1 since the last visit.   no longer doing any ppi but still on h2 at hs  No obvious day to day or daytime variabilty or assoc   cp or chest tightness, subjective wheeze overt sinus or hb symptoms. No unusual exp hx or h/o childhood pna/ asthma or knowledge of premature birth.  Sleeping ok without nocturnal  or early am exacerbation  of respiratory  c/o's or need for noct saba. Also denies any obvious fluctuation of symptoms with weather or environmental changes or other aggravating or alleviating factors except as outlined above   Current Medications, Allergies, Complete Past Medical History, Past Surgical History, Family History, and Social History were reviewed in [employer] record.  ROS  The following are not active complaints unless bolded sore throat, dysphagia, dental problems, itching, sneezing,  nasal congestion or excess/ purulent secretions, ear ache,   fever, chills, sweats, unintended wt loss, pleuritic or exertional cp, hemoptysis,  orthopnea pnd or leg swelling, presyncope, palpitations, heartburn, abdominal pain, anorexia, nausea, vomiting, diarrhea  or change in bowel or urinary habits, change in stools or urine, dysuria,hematuria,  rash, arthralgias, visual complaints, headache, numbness weakness or ataxia or  problems with walking or coordination,  change in mood/affect or memory.                Objective:   Physical Exam  amb  wf nad  04/09/2015     157  Wt Readings from Last 3 Encounters:  02/26/15 153 lb (69.4 kg)  03/22/12 148 lb 6.4 oz (67.314 kg)  12/19/11 146 lb 12.8 oz (66.588 kg)    Vital signs reviewed  HEENT: nl dentition, turbinates, and orophanx. Nl external ear canals without cough reflex   NECK :  without JVD/Nodes/TM/ nl carotid upstrokes bilaterally   LUNGS: no acc muscle use, clear to A and P bilaterally without cough on  insp or exp maneuvers   CV:  RRR  no s3 or murmur or increase in P2, no edema   ABD:  soft and nontender with nl excursion in the supine position. No bruits or organomegaly, bowel sounds nl  MS:  warm without deformities, calf tenderness, cyanosis or clubbing  SKIN: warm and dry without lesions    NEURO:  alert, approp, no deficits    CXR PA and Lateral:   02/26/2015 :     I personally reviewed images and agree with radiology impression as follows:      No acute cardiopulmonary disease.        Assessment & Plan:

## 2015-04-09 NOTE — Assessment & Plan Note (Signed)
The proper method of use, as well as anticipated side effects, of a metered-dose inhaler are discussed and demonstrated to the patient. Improved effectiveness after extensive coaching during this visit to a level of approximately  90%   I had an extended final summary discussion with the patient reviewing all relevant studies completed to date and  lasting 15 to 20 minutes of a 25 minute visit on the following issues:     1) The key with cough variant asthma is that higher doses of ics may cause more cough than cough relief due to irritating effedts on the upper airway   2) dulera 100 = symbicort 80 2bid so she should use the preferred drug on her plan  3) GERD is not eliminated as a suspect but if doing well on the low dose ics then for sure can liberalize the diet ;  However, Explained the natural history of uri and why it's necessary in patients at risk to treat GERD aggressively - at least  short term -   to reduce risk of evolving cyclical cough initially  triggered by epithelial injury and a heightened sensitivty to the effects of any upper airway irritants,  most importantly acid - related - then perpetuated by epithelial injury related to the cough itself as the upper airway collapses on itself.  That is, the more sensitive the epithelium becomes once it is damaged by the virus, the more the ensuing irritability> the more the cough, the more the secondary reflux (especially in those prone to reflux) the more the irritation of the sensitive mucosa and so on in a  Classic cyclical pattern.    4) Each maintenance medication was reviewed in detail including most importantly the difference between maintenance and as needed and under what circumstances the prns are to be used.  Please see instructions for details which were reviewed in writing and the patient given a copy  5)  pulmonary f/u can be prn

## 2015-04-09 NOTE — Progress Notes (Signed)
PFT done today. 

## 2015-04-09 NOTE — Patient Instructions (Signed)
Continue dulera 100 or symbicort 80 Take 2 puffs first thing in am and then another 2 puffs about 12 hours later.   In the event of a respiratory flare add back the prilosec 40 or protonix 40 Take 30-60 min before first meal of the day  and return here better

## 2015-04-09 NOTE — Assessment & Plan Note (Signed)
-   PFT's 04/09/2015 wnl s am dulera except erv 46% c/w body habitus  Encouraged wt loss/ f/u primary care

## 2015-08-13 ENCOUNTER — Ambulatory Visit
Admission: RE | Admit: 2015-08-13 | Discharge: 2015-08-13 | Disposition: A | Discharge status: Home / Self Care | Payer: Medicare Other | Source: Ambulatory Visit | Attending: Family Medicine | Admitting: Family Medicine

## 2015-08-13 ENCOUNTER — Other Ambulatory Visit: Payer: Self-pay | Admitting: Family Medicine

## 2015-08-13 DIAGNOSIS — S93401A Sprain of unspecified ligament of right ankle, initial encounter: Secondary | ICD-10-CM

## 2017-11-17 ENCOUNTER — Ambulatory Visit: Payer: Medicare Other | Admitting: Internal Medicine

## 2017-11-22 ENCOUNTER — Encounter: Payer: Self-pay | Admitting: Internal Medicine

## 2017-11-22 ENCOUNTER — Ambulatory Visit: Payer: Medicare Other | Admitting: Internal Medicine

## 2017-11-22 ENCOUNTER — Other Ambulatory Visit (INDEPENDENT_AMBULATORY_CARE_PROVIDER_SITE_OTHER): Payer: Medicare Other

## 2017-11-22 VITALS — BP 130/78 | HR 91 | Ht 61.0 in | Wt 160.0 lb

## 2017-11-22 DIAGNOSIS — R0609 Other forms of dyspnea: Secondary | ICD-10-CM | POA: Diagnosis not present

## 2017-11-22 DIAGNOSIS — J45991 Cough variant asthma: Secondary | ICD-10-CM

## 2017-11-22 LAB — CBC WITH DIFFERENTIAL/PLATELET
BASOS ABS: 0.1 10*3/uL (ref 0.0–0.1)
Basophils Relative: 1.1 % (ref 0.0–3.0)
Eosinophils Absolute: 0.1 10*3/uL (ref 0.0–0.7)
Eosinophils Relative: 0.9 % (ref 0.0–5.0)
HCT: 48.6 % — ABNORMAL HIGH (ref 36.0–46.0)
Hemoglobin: 15.8 g/dL — ABNORMAL HIGH (ref 12.0–15.0)
LYMPHS ABS: 3.1 10*3/uL (ref 0.7–4.0)
Lymphocytes Relative: 30.1 % (ref 12.0–46.0)
MCHC: 32.5 g/dL (ref 30.0–36.0)
MCV: 95 fl (ref 78.0–100.0)
MONO ABS: 0.6 10*3/uL (ref 0.1–1.0)
Monocytes Relative: 6.1 % (ref 3.0–12.0)
Neutro Abs: 6.3 10*3/uL (ref 1.4–7.7)
Neutrophils Relative %: 61.8 % (ref 43.0–77.0)
Platelets: 307 10*3/uL (ref 150.0–400.0)
RBC: 5.11 Mil/uL (ref 3.87–5.11)
RDW: 13.7 % (ref 11.5–15.5)
WBC: 10.2 10*3/uL (ref 4.0–10.5)

## 2017-11-22 NOTE — Progress Notes (Signed)
Subjective:    Patient ID: Kristin Ball, female    DOB: 06-22-1946,    MRN: [id number]    Brief patient profile:  71 yowf never smoker remembers persitent symptoms dating back to childhood esp spring fall itching sneezing coughing some wheezing s dx or treatment of any kind until age 71 at [facility] (husband was marine) dx asthma better on saba/ eval by Dr Bernita Buffy dx as cat allergy/n/a did not recommend shots rx [email or web address] [name] and referred to pulmonary clinic  by Dr Dema Severin for abn office spirometry but completely nl pfts 04/09/2015 s am dulera.    History of Present Illness  02/26/2015 1st [facility] office visit/ Kristin Ball   Chief Complaint  Patient presents with  . Pulmonary Consult    Referred by Dr. Harlan Stains. Pt states dxed with asthma age 71. She states that her breathing has been bothering her more for the past 2 yrs. She states that she gets SOB mainly with exertion "strenuos things", walking fast and walking up 1 flight of stairs. She has occ cough- non prod but "feel mucus in my throat".  She uses proair on average 2-3 times per day.   over the last few years decrease in activity tolerance and more need for albuterol  Gradual trend  Thyroid removed 2012  Would like do nature walks but not able to do, esp hills/ x 2 years, some better with saba  Prev intol to acei Has gerd only uses prn hb rec Omeprazole 20 x 2 or Pantoprazole (protonix) 40 mg   Take 30-60 min before first meal of the day and Pepcid 20 mg one bedtime until return to office - this is the best way to tell whether stomach acid is contributing to your problem.   GERD  Change to dulera 100 Take 2 puffs first thing in am and then another 2 puffs about 12 hours later.  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 Follow-up    PFT done today. Pt states breathing seems better with [facility], but she  is unable to afford med. Cough only bothers her every now and then.  She has only used albuterol x 1 since the last visit.   no longer doing any ppi but still on h2 at hs rec Continue dulera 100 or symbicort 80 Take 2 puffs first thing in am and then another 2 puffs about 12 hours later. In the event of a respiratory flare add back the prilosec 40 or protonix 40 Take 30-60 min before first meal of the day  and return here better       11/22/2017  Re-establish / extended f/u ov/Kristin Ball re:  Cough variant asthma with variable doe even on symb 160 2bid / gerd rx with h2's and requesting [facility] parking  Chief Complaint  Patient presents with  . Follow-up    Breathing has been doing about the same. She states that she wants a handicap placard for the days that she has a bad breathing day so she does not have to walk too far. She has good days and bad days, depending on the weather. She is using her rescue inhaler 3-4 x per wk on average.    def felt better on higher dose of symbicort/ worse sob in heat or cold Sleeping disturbed by sob/ wheeze. Dry cough sev times a month/ ?  Improved with 30 degrees wedge / not typically waking up but up 3 x per month Takes symb first thing then again about 12 hours latedr avg albuterol 2-3 times per month  Assoc variable hoarseness ever since thryoid surgery  2012  But neg ENT eval per pt  Has HB but afraid of taking ppi's "it causes kidneys to fail"   No obvious day to day or daytime variability or assoc excess/ purulent sputum or mucus plugs or hemoptysis or cp or chest tightness, subjective wheeze or overt sinus or hb symptoms. No unusual exposure hx or h/o childhood pna/ asthma or knowledge of premature birth.  Sleeping ok flat without nocturnal  or early am exacerbation  of respiratory  c/o's or need for noct saba. Also denies any obvious fluctuation of symptoms with weather or environmental changes or other aggravating or alleviating factors except as outlined  above   Current Allergies, Complete Past Medical History, Past Surgical History, Family History, and Social History were reviewed in [employer] record.  ROS  The following are not active complaints unless bolded Hoarseness, sore throat, dysphagia, dental problems, itching, sneezing,  nasal congestion or discharge of excess mucus or purulent secretions, ear ache,   fever, chills, sweats, unintended wt loss or wt gain, classically pleuritic or exertional cp,  orthopnea pnd or leg swelling, presyncope, palpitations, abdominal pain, anorexia, nausea, vomiting, diarrhea  or change in bowel habits or change in bladder habits, change in stools or change in urine, dysuria, hematuria,  rash, arthralgias, visual complaints, headache, numbness, weakness or ataxia or problems with walking or coordination,  change in mood/affect or memory.        Current Meds  Medication Sig  . albuterol (PROVENTIL HFA;VENTOLIN HFA) 108 (90 BASE) MCG/ACT inhaler Inhale 2 puffs into the lungs every 4 (four) hours as needed. For shortness of breath   . amLODipine (NORVASC) 5 MG tablet Take 1 tablet by mouth daily.  . cholecalciferol (VITAMIN D) 1000 UNITS tablet Take 1,000 Units by mouth daily.  . famotidine (PEPCID) 20 MG tablet One at bedtime  . fluticasone (FLONASE) 50 MCG/ACT nasal spray Place 2 sprays into both nostrils 2 (two) times daily.  . irbesartan (AVAPRO) 300 MG tablet Take 1 tablet by mouth daily.  . montelukast (SINGULAIR) 10 MG tablet Daily.  . Multiple Vitamin (MULTIVITAMIN) tablet Take 1 tablet by mouth daily.  . SYMBICORT 160-4.5 MCG/ACT inhaler Inhale 2 puffs into the lungs 2 (two) times daily.  Marland Kitchen SYNTHROID 88 MCG tablet Take 88 mcg by mouth daily before breakfast.                Objective:   Physical Exam  Somber amb wf breaks out in tears when questioned re symptoms" nobody believes me"    11/22/2017     160  04/09/2015     157     02/26/15 153 lb (69.4 kg)    03/22/12 148 lb 6.4 oz (67.314 kg)  12/19/11 146 lb 12.8 oz (66.588 kg)    Vital signs reviewed - Note on arrival 02 sats  96% on RA     HEENT: nl dentition, turbinates bilaterally, and oropharynx. Nl external ear canals without cough reflex   NECK :  without JVD/Nodes/TM/ nl carotid upstrokes bilaterally   LUNGS: no acc muscle use,  Nl contour chest which is clear to A and P bilaterally without cough on insp or exp maneuvers   CV:  RRR  no s3 or murmur or increase  in P2, and no edema   ABD:  soft and nontender with nl inspiratory excursion in the supine position. No bruits or organomegaly appreciated, bowel sounds nl  MS:  Nl gait/ ext warm without deformities, calf tenderness, cyanosis or clubbing No obvious joint restrictions   SKIN: warm and dry without lesions    NEURO:  alert, approp, nl sensorium with  no motor or cerebellar deficits apparent.                 Assessment & Plan:

## 2017-11-22 NOTE — Patient Instructions (Signed)
Symbicort Take 2 puffs first thing in am and then another 2 puffs about 12 hours later.    Only use your albuterol as a rescue medication to be used if you can't catch your breath by resting or doing a relaxed purse lip breathing pattern.  Ok to use it half way thru a difficult task to see if the second half is easier. - The less you use it, the better it will work when you need it. - Ok to use up to 2 puffs  every 4 hours if you must but call for immediate appointment if use goes up over your usual need - Don't leave home without it !!  (think of it like the spare tire for your car)    Work on inhaler technique:  relax and gently blow all the way out then take a nice smooth deep breath back in, triggering the inhaler at same time you start breathing in.  Hold for up to 5 seconds if you can. Blow out thru nose. Rinse and gargle with water when done      To get the most out of exercise, you need to be continuously aware that you are short of breath, but never out of breath, for 30 minutes daily. As you improve, it will actually be easier for you to do the same amount of exercise  in  30 minutes so always push to the level where you are short of breath.  If not better after a month please call to schedule CPST   Please remember to go to the lab department downstairs in the basement  for your tests - we will call you with the results when they are available.    If you are satisfied with your treatment plan,  let your doctor know and he/she can either refill your medications or you can return here when your prescription runs out.     If in any way you are not 100% satisfied,  please tell us.  If 100% better, tell your friends!  Pulmonary follow up is as needed

## 2017-11-23 ENCOUNTER — Telehealth: Payer: Self-pay | Admitting: Internal Medicine

## 2017-11-23 ENCOUNTER — Encounter: Payer: Self-pay | Admitting: Internal Medicine

## 2017-11-23 LAB — RESPIRATORY ALLERGY PROFILE REGION II ~~LOC~~
Allergen, A. alternata, m6: <0.1 kU/L
Allergen, Comm Silver Birch, t9: <0.1 kU/L
Allergen, P. notatum, m1: <0.1 kU/L
BOX ELDER: 0.33 kU/L — AB
Bermuda Grass: <0.1 kU/L
CLADOSPORIUM HERBARUM (M2) IGE: <0.1 kU/L
CLASS: 0
CLASS: 0
CLASS: 0
CLASS: 0
CLASS: 0
CLASS: 0
CLASS: 0
CLASS: 0
CLASS: 0
CLASS: 0
COMMON RAGWEED (SHORT) (W1) IGE: <0.1 kU/L
Cat Dander: <0.1 kU/L
Class: 0
Class: 0
Class: 0
Class: 0
Class: 0
Class: 0
Class: 0
Class: 0
Class: 0
Class: 0
Class: 0
Class: 0
Class: 0
Class: 0
IgE (Immunoglobulin E), Serum: 377 kU/L — ABNORMAL HIGH (ref <=114)
Johnson Grass: <0.1 kU/L
Pecan/Hickory Tree IgE: <0.1 kU/L
Sheep Sorrel IgE: <0.1 kU/L

## 2017-11-23 LAB — INTERPRETATION:

## 2017-11-23 NOTE — Assessment & Plan Note (Addendum)
- PFTs 04/09/2015 s airflow obst  - 04/09/2015 p extensive coaching HFA effectiveness =    90%        - Spirometry 11/22/2017   wnl p am symb 160 though somewhat truncated effort dep (upper) portion of f/v loop  - Allergy profile 11/22/2017 >  Eos 0.1 /  IgE   FENO 11/22/2017  =  Could not perform   - 11/22/2017  After extensive coaching inhaler device  effectiveness =    50% (too short Ti)       Symptoms are markedly disproportionate to objective findings and not clear this is actually much of a  lung problem but pt does appear to have difficult to sort out respiratory symptoms of unknown origin for which  DDX  = almost all start with A and  include Adherence, Ace Inhibitors, Acid Reflux, Active Sinus Disease, Alpha 1 Antitripsin deficiency, Anxiety masquerading as Airways dz,  ABPA,  Allergy(esp in young), Aspiration (esp in elderly), Adverse effects of meds,  Active smokers, A bunch of PE's (a small clot burden can't cause this syndrome unless there is already severe underlying pulm or vascular dz with poor reserve) plus two Bs  = Bronchiectasis and Beta blocker use..and one C= CHF     Adherence is always the initial "prime suspect" and is a multilayered concern that requires a "trust but verify" approach in every patient - starting with knowing how to use medications, especially inhalers, correctly, keeping up with refills and understanding the fundamental difference between maintenance and prns vs those medications only taken for a very short course and then stopped and not refilled.  - see hfa teaching > needs to work harder on adequate tehnique then may be able to wean to lower stength and avoid adverse effects of high dose ics on the upper airway   ? Acid (or non-acid) GERD > always difficult to exclude as up to 75% of pts in some series report no assoc GI/ Heartburn symptoms> not clear why it is she feels she's at risk for PPI induced renal dysfunction as this is extremely rare whereas  destablization of the upper  Airway mimicking asthma is extremely common at her age/ wt  and may be contributing to her symptoms > may need to reconsider short term ppi bid ac, esp prior to CPST (see below) In meantime gerd diet /h2 should be continued     ? Allergy > send profile, continue singulair and symbicort 160   ? Anxiety/depression/ deconditioning  > usually at the bottom of this list of usual suspects but should be much higher on this pt's based on H and P and note already on psychotropics  > Follow up per Primary Care planned      If dx remains in doubt, next step is cpst with before and after spirometry.    I had an extended discussion with the patient reviewing all relevant studies completed to date and  lasting 25 minutes of a 40  minute extended office visit to re-establish     re  severe non-specific but potentially very serious refractory respiratory symptoms of uncertain and potentially multiple  etiologies.   Each maintenance medication was reviewed in detail including most importantly the difference between maintenance and prns and under what circumstances the prns are to be triggered using an action plan format that is not reflected in the computer generated alphabetically organized AVS.    Please see AVS for specific instructions unique to this office visit that  I personally wrote and verbalized to the the pt in detail and then reviewed with pt  by my nurse highlighting any changes in therapy/plan of care  recommended at today's visit.

## 2017-11-23 NOTE — Assessment & Plan Note (Addendum)
-   PFT's 04/09/2015 wnl s am dulera except erv 46% c/w body habitus - 11/22/2017  Walked RA x 3 laps @ 185 ft each stopped due to  End of study, nl pace,   or desat - mild sob at end     See above re asthma and ? Need to do cpst if doesn't benefit from regular paced submax exercise.  In meantime can use [facility] parking if having a bad day.  see avs for instructions unique to this ov

## 2017-11-23 NOTE — Progress Notes (Signed)
LMTCB

## 2017-11-23 NOTE — Telephone Encounter (Signed)
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 Melvyn Novas. Pt verbalized understanding and denied any questions.

## 2018-12-05 HISTORY — PX: MASTECTOMY: SHX3

## 2019-01-14 ENCOUNTER — Other Ambulatory Visit: Payer: Self-pay | Admitting: Radiology

## 2019-01-21 ENCOUNTER — Encounter: Payer: Self-pay | Admitting: *Deleted

## 2019-01-21 ENCOUNTER — Other Ambulatory Visit: Payer: Self-pay | Admitting: *Deleted

## 2019-01-21 ENCOUNTER — Encounter: Payer: Self-pay | Admitting: Licensed Clinical Social Worker

## 2019-01-21 ENCOUNTER — Telehealth: Payer: Self-pay | Admitting: Oncology

## 2019-01-21 DIAGNOSIS — Z170 Estrogen receptor positive status [ER+]: Principal | ICD-10-CM

## 2019-01-21 DIAGNOSIS — C50412 Malignant neoplasm of upper-outer quadrant of left female breast: Secondary | ICD-10-CM | POA: Insufficient documentation

## 2019-01-21 DIAGNOSIS — C50212 Malignant neoplasm of upper-inner quadrant of left female breast: Secondary | ICD-10-CM | POA: Insufficient documentation

## 2019-01-21 NOTE — Telephone Encounter (Signed)
Spoke to patient to confirm morning [facility] appointment for 2/19, [facility] patient no packet sent

## 2019-01-22 NOTE — Progress Notes (Signed)
[place]  Telephone:([phone]) [phone] Fax:(336) [phone]    ID: Kristin Ball DOB: 10-23-1946  MR#: [id number]  TFT#:[id number]  Patient Care Team: Harlan Stains, MD as PCP - General (Family Medicine) Erroll Luna, MD as Consulting Physician (General Surgery) Magrinat, Virgie Dad, MD as Consulting Physician (Oncology) Kyung Rudd, MD as Consulting Physician (Radiation Oncology) Rockwell Germany, RN as Oncology Nurse Navigator Mauro Kaufmann, RN as Oncology Nurse Navigator OTHER MD: Dr. Grandville Silos (Ortho), Dr. Melvyn Novas    CHIEF COMPLAINT: Estrogen receptor positive breast cancer  CURRENT TREATMENT: Definitive surgery pending   HISTORY OF CURRENT ILLNESS: "Kristin Ball" had routine screening mammography on 01/02/2019 showing a possible abnormality in the left breast. She underwent  left breast ultrasonography at [facility] on 01/08/2019 showing: On physical exam, there is a flat, palpable mass at 12:00 left breast at the areolar margin. Sonography over this area demonstrates a 2.6 x 1.0 cm irregular solid mass in the left breast at 12 o'clock anterior depth. This irregular solid mass is hypoechoic with posterior acoustic enhancement. This correlates as palpated. Color flow imaging demonstrates that there is vascularity present. Approximately 2.8 cm medial to the 12:00 mass is a second 0.5 cm taller than wide irregular hypoechoic mass at 9:00 3 cm from the nipple. This solid mass demonstrates vascularity and correlates with the new medial anterior mass on mammography. In the upper outer left breast, there is irregular lobulated tissue extending from the dominant mass to 3:00 4 to 5 cm from the nipple. There are interspersed simple appearing cysts within the lobulated tissue extending laterally from 12:00. Findings concerning for ductal extension of the 12:00 mass. A solid 0.7 cm oval hypoechoic mass at 3:00 5 cm from the nipple appears to be the most lateral solid suspicious component. Incidental  1 cm simple cyst in the left breast 1:00 7 cm from the nipple. Two normal appearing nodes were seen sonographically in the left axilla.   Accordingly on 01/14/2019 she proceeded to biopsy of the left breast area at 12 o'clock in question. The pathology from this procedure showed ([id number]): invasive ductal carcinoma. Prognostic indicators significant for: estrogen receptor, 95% positive with strong staining intensity and progesterone receptor, 0% negative. Proliferation marker Ki67 at 5%. HER2 negative (1+) by immunohistochemistry.  Additionally, on 01/14/2019 she proceeded to biopsy of the left breast area at 3 o'clock in question. The pathology from this procedure showed ([id number]): invasive ductal carcinoma, morphologically similar to the mass at 12 o'clock.  Finally, on 01/14/2019 she proceeded to biopsy of the left breast area at 9 o'clock in question. The pathology from this procedure showed ([id number]): invasive ductal carcinoma, grade I. Prognostic indicators significant for: estrogen receptor, 95% positive with strong staining intensity and progesterone receptor, 20% positive with moderate staining intensity. Proliferation marker Ki67 at 10%. HER2 negative (1+) by immunohistochemistry.   The patient's subsequent history is as detailed below.   INTERVAL HISTORY: Kristin Ball was evaluated in the multidisciplinary breast cancer clinic on 01/23/2019 accompanied by her best friend, [name].   Her case was also presented at the multidisciplinary breast cancer conference on the same day. At that time a preliminary plan was proposed: Left mastectomy with sentinel lymph node sampling, Oncotype testing, genetics, and antiestrogens.  It was felt likely she would not need adjuvant radiation    REVIEW OF SYSTEMS: There were no specific symptoms leading to Kristin Ball, which was routinely scheduled. For exercise, she walks, and cleans her home. The patient denies unusual headaches,  visual changes,  nausea, vomiting, stiff neck, dizziness, or gait imbalance. There has been no cough, phlegm production, or pleurisy, no chest pain or pressure, and no change in bowel or bladder habits. The patient denies fever, rash, bleeding, unexplained fatigue or unexplained weight loss. A detailed review of systems was otherwise entirely negative.   PAST MEDICAL HISTORY: Past Medical History:  Diagnosis Date  . Allergic rhinitis   . Anxiety   . Asthma   . Bronchitis   . DDD (degenerative disc disease)   . Depression   . Gastroesophageal reflux   . Goiter 2012  . Hypercholesteremia   . Hypertension   . Multiple thyroid nodules   . Osteopenia      PAST SURGICAL HISTORY: Past Surgical History:  Procedure Laterality Date  . APPENDECTOMY    . BREAST BIOPSY     X3 benign  . left shoulder surgery  02/2011  . right shoulder surgery    . TOTAL THYROIDECTOMY  10/06/11  . tugal ligation       FAMILY HISTORY: Family History  Problem Relation Age of Onset  . Hypertension Ball   . Diabetes Ball   . Hyperlipidemia Ball   . Cancer Ball        prostate  . Heart attack Mother   . Hyperlipidemia Mother   . Colon cancer Ball   . Breast cancer Paternal 32    Kristin Ball died at age 14. Patients' mother died age 19. The patient has 1 Ball and 1 sister. Kristin Ball was diagnosed with prostate cancer in his 39's and a paternal aunt was diagnosed with breast cancer. Patient denies anyone in her family having ovarian, prostate or pancreatic cancer. Kristin Ball was diagnosed with colon cancer in his 101's.    GYNECOLOGIC HISTORY:  No LMP recorded. Patient is postmenopausal. 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 She lives alone and has not pets.   ADVANCED DIRECTIVES: It is currently her  sister, [name], but she intends to change this and name her niece, [name]. Blood.    HEALTH MAINTENANCE: Social History   Tobacco Use  . Smoking status: Never Smoker  . Smokeless tobacco: Never Used  Substance Use Topics  . Alcohol use: Yes    Alcohol/week: 1.0 standard drinks    Types: 1 Glasses of wine per week  . Drug use: No    Colonoscopy: Never  PAP: Remote  Bone density: yes, "ok."   Allergies  Allergen Reactions  . Darvon Hives  . Ivp Dye [Iodinated Diagnostic Agents] Hives  . Statins     Joint pain Kidney failure  Confusion    Current Outpatient Medications  Medication Sig Dispense Refill  . albuterol (PROVENTIL HFA;VENTOLIN HFA) 108 (90 BASE) MCG/ACT inhaler Inhale 2 puffs into the lungs every 4 (four) hours as needed. For shortness of breath     . amLODipine (NORVASC) 5 MG tablet Take 1 tablet by mouth daily.  0  . cholecalciferol (VITAMIN D) 1000 UNITS tablet Take 1,000 Units by mouth daily.    Marland Kitchen escitalopram (LEXAPRO) 10 MG tablet Take 10 mg by mouth daily.    . famotidine (PEPCID) 20 MG tablet One at bedtime 30 tablet 11  . fluticasone (FLONASE) 50 MCG/ACT nasal spray Place 2 sprays into both nostrils 2 (two) times daily.  3  . irbesartan (AVAPRO) 300 MG tablet Take 1 tablet by mouth daily.  1  . montelukast (SINGULAIR) 10 MG tablet Daily.    . SYMBICORT 160-4.5 MCG/ACT inhaler Inhale 2 puffs into the lungs 2 (two) times daily.  3  . Multiple Vitamin (MULTIVITAMIN) tablet Take 1 tablet by mouth daily.    Marland Kitchen SYNTHROID 88 MCG tablet Take 100 mcg by mouth daily before breakfast. 100 mcg daily     No current facility-administered medications for this visit.      OBJECTIVE: Middle-aged white woman who appears older than stated age  81:   01/23/19 0840  BP: 140/67  Pulse: 76  Resp: 18  Temp: 98.7 F (37.1 C)  SpO2: 100%     Body mass index is 31.53 kg/m.   Wt Readings from Last 3 Encounters:  01/23/19 160 lb 1.6 oz (72.6 kg)  11/22/17  160 lb (72.6 kg)  04/09/15 157 lb (71.2 kg)      ECOG FS:1 - Symptomatic but completely ambulatory  Ocular: Sclerae unicteric, pupils round and equal Ear-nose-throat: Oropharynx clear and moist Lymphatic: No cervical or supraclavicular adenopathy Lungs no rales or rhonchi Heart regular rate and rhythm Abd soft, nontender, positive bowel sounds MSK no focal spinal tenderness, no joint edema Neuro: non-focal, well-oriented, appropriate affect Breasts: The right breast is unremarkable.  The left breast is status post recent biopsies.  There are multiple ecchymoses.  There are no skin or nipple changes of concern.  Both axillae are benign.   LAB RESULTS:  CMP     Component Value Date/Time   NA 147 (H) 01/23/2019 0820   K 5.3 (H) 01/23/2019 0820   CL 108 01/23/2019 0820   CO2 26 01/23/2019 0820   GLUCOSE 138 (H) 01/23/2019 0820   BUN 16 01/23/2019 0820   CREATININE 0.97 01/23/2019 0820   CALCIUM 9.8 01/23/2019 0820   PROT 7.5 01/23/2019 0820   ALBUMIN 4.3 01/23/2019 0820   AST 16 01/23/2019 0820   ALT 23 01/23/2019 0820   ALKPHOS 87 01/23/2019 0820   BILITOT 0.6 01/23/2019 0820   GFRNONAA 58 (L) 01/23/2019 0820   GFRAA >60 01/23/2019 0820    No results found for: TOTALPROTELP, ALBUMINELP, A1GS, A2GS, BETS, BETA2SER, GAMS, MSPIKE, SPEI  No results found for: KPAFRELGTCHN, LAMBDASER, KAPLAMBRATIO  Lab Results  Component Value Date   WBC 7.2 01/23/2019   NEUTROABS 3.9 01/23/2019   HGB 14.7 01/23/2019   HCT 47.7 (H) 01/23/2019   MCV 96.4 01/23/2019   PLT 262 01/23/2019    _0 @  No results found for: LABCA2  No components found for: [id number]  No results for input(s): INR in the last 168 hours.  No results found for: LABCA2  No results found for: DDU202  No results found for: RKY706  No results found for: CBJ628  No results found for: CA2729  No components found for: HGQUANT  No results found for: CEA1 / No results found for: CEA1   No  results found for: AFPTUMOR  No results found for: CHROMOGRNA  No results found for: PSA1  Appointment on 01/23/2019  Component Date Value Ref Range Status  . Sodium 01/23/2019 147* 135 - 145 mmol/L Final  . Potassium 01/23/2019 5.3* 3.5 - 5.1 mmol/L Final  . Chloride 01/23/2019 108  98 - 111 mmol/L Final  . CO2 01/23/2019 26  22 - 32 mmol/L Final  . Glucose, Bld 01/23/2019 138* 70 - 99 mg/dL Final  . BUN 01/23/2019 16  8 -  23 mg/dL Final  . Creatinine 01/23/2019 0.97  0.44 - 1.00 mg/dL Final  . Calcium 01/23/2019 9.8  8.9 - 10.3 mg/dL Final  . Total Protein 01/23/2019 7.5  6.5 - 8.1 g/dL Final  . Albumin 01/23/2019 4.3  3.5 - 5.0 g/dL Final  . AST 01/23/2019 16  15 - 41 U/L Final  . ALT 01/23/2019 23  0 - 44 U/L Final  . Alkaline Phosphatase 01/23/2019 87  38 - 126 U/L Final  . Total Bilirubin 01/23/2019 0.6  0.3 - 1.2 mg/dL Final  . GFR, Est Non Af Am 01/23/2019 58* >60 mL/min Final  . GFR, Est AFR Am 01/23/2019 >60  >60 mL/min Final  . Anion gap 01/23/2019 13  5 - 15 Final   Performed at [facility], [place] [address], [place], [place] [address]  . WBC Count 01/23/2019 7.2  4.0 - 10.5 K/uL Final  . RBC 01/23/2019 4.95  3.87 - 5.11 MIL/uL Final  . Hemoglobin 01/23/2019 14.7  12.0 - 15.0 g/dL Final  . HCT 01/23/2019 47.7* 36.0 - 46.0 % Final  . MCV 01/23/2019 96.4  80.0 - 100.0 fL Final  . MCH 01/23/2019 29.7  26.0 - 34.0 pg Final  . MCHC 01/23/2019 30.8  30.0 - 36.0 g/dL Final  . RDW 01/23/2019 13.0  11.5 - 15.5 % Final  . Platelet Count 01/23/2019 262  150 - 400 K/uL Final  . nRBC 01/23/2019 0.0  0.0 - 0.2 % Final  . Neutrophils Relative % 01/23/2019 55  % Final  . Neutro Abs 01/23/2019 3.9  1.7 - 7.7 K/uL Final  . Lymphocytes Relative 01/23/2019 35  % Final  . Lymphs Abs 01/23/2019 2.5  0.7 - 4.0 K/uL Final  . Monocytes Relative 01/23/2019 7  % Final  . Monocytes Absolute 01/23/2019 0.5  0.1 - 1.0 K/uL Final  . Eosinophils Relative 01/23/2019 2  %  Final  . Eosinophils Absolute 01/23/2019 0.2  0.0 - 0.5 K/uL Final  . Basophils Relative 01/23/2019 1  % Final  . Basophils Absolute 01/23/2019 0.1  0.0 - 0.1 K/uL Final  . Immature Granulocytes 01/23/2019 0  % Final  . Abs Immature Granulocytes 01/23/2019 0.03  0.00 - 0.07 K/uL Final   Performed at [facility], [place] [address] [place], [place] [address]    (this displays the last labs from the last 3 days)  No results found for: TOTALPROTELP, ALBUMINELP, A1GS, A2GS, BETS, BETA2SER, GAMS, MSPIKE, SPEI (this displays SPEP labs)  No results found for: KPAFRELGTCHN, LAMBDASER, KAPLAMBRATIO (kappa/lambda light chains)  No results found for: HGBA, HGBA2QUANT, HGBFQUANT, HGBSQUAN (Hemoglobinopathy evaluation)   No results found for: LDH  No results found for: IRON, TIBC, IRONPCTSAT (Iron and TIBC)  No results found for: FERRITIN  Urinalysis    Component Value Date/Time   COLORURINE YELLOW 09/28/2011 1000   APPEARANCEUR CLEAR 09/28/2011 1000   LABSPEC 1.026 09/28/2011 1000   PHURINE 7.5 09/28/2011 1000   GLUCOSEU NEGATIVE 09/28/2011 1000   HGBUR NEGATIVE 09/28/2011 1000   BILIRUBINUR NEGATIVE 09/28/2011 1000   KETONESUR NEGATIVE 09/28/2011 1000   PROTEINUR NEGATIVE 09/28/2011 1000   UROBILINOGEN 1.0 09/28/2011 1000   NITRITE NEGATIVE 09/28/2011 1000   LEUKOCYTESUR NEGATIVE 09/28/2011 1000     STUDIES:  Outside studies reviewed  ELIGIBLE FOR AVAILABLE RESEARCH PROTOCOL: no   ASSESSMENT: 73 y.o. [place], [place] woman status post left breast overlapping sites biopsies (2)  for a clinical mT2 N0, stage IIA invasive ductal  carcinoma, grade 2, estrogen receptor positive, progesterone receptor negative, HER-2 not amplified, with an MIB-1 of 5%  (a) a separate cT1a tumor was biopsied and found to be invasive ductal carcinoma, grade 1, estrogen and progesterone receptor positive, HER-2 nonamplified, with an MIB-1 of 10%  (1) definitive surgery  pending  (2) Oncotype to be obtained from the definitive surgical sample  (3) antiestrogens to start at the completion of local treatment  (4) patient opted against genetics testing    PLAN: I spent approximately 60 minutes face to face with Junie with more than 50% of that time spent in counseling and coordination of care. Specifically we reviewed the biology of the patient's diagnosis and the specifics of her situation.  We first reviewed the fact that cancer is not one disease but more than 100 different diseases and that it is important to keep them separate-- otherwise when friends and relatives discuss their own cancer experiences with Shalin confusion can result. Similarly we explained that if breast cancer spreads to the bone or liver, the patient would not have bone cancer or liver cancer, but breast cancer in the bone and breast cancer in the liver: one cancer in three places-- not 3 different cancers which otherwise would have to be treated in 3 different ways.  We discussed the difference between local and systemic therapy. In terms of loco-regional treatment, lumpectomy plus radiation is equivalent to mastectomy as far as survival is concerned. For this reason we generally recommend breast conserving surgery.  However in Vicki's case the extent of disease is such that even with multiple lumpectomies the breast could not be saved.  Accordingly the plan in her case will be mastectomy with sentinel lymph node sampling.  We then discussed the rationale for systemic therapy. There is some risk that this cancer may have already spread to other parts of her body. Patients frequently ask at this point about bone scans, CAT scans and PET scans to find out if they have occult breast cancer somewhere else. The problem is that in early stage disease we are much more likely to find false positives then true cancers and this would expose the patient to unnecessary procedures as well as unnecessary  radiation. Scans cannot answer the question the patient really would like to know, which is whether she has microscopic disease elsewhere in her body. For those reasons we do not recommend them.  Of course we would proceed to aggressive evaluation of any symptoms that might suggest metastatic disease, but that is not the case here.  Next we went over the options for systemic therapy which are anti-estrogens, anti-HER-2 immunotherapy, and chemotherapy. Albertia does not meet criteria for anti-HER-2 immunotherapy. She is a good candidate for anti-estrogens.  The question of chemotherapy is more complicated. Chemotherapy is most effective in rapidly growing, aggressive tumors. It is much less effective in low-grade, slow growing cancers, like Teriann 's. For that reason we are going to request an Oncotype from the definitive surgical sample, as suggested by NCCN guidelines.   I did discuss genetics with the patient and clarified some of her misconceptions but at this point she prefers not to undergo genetics testing.  Khira has a good understanding of the treatment plan. She agrees with it. She knows the goal of treatment in her case is cure. She will call with any problems that may develop before her next visit here.   Magrinat, Virgie Dad, MD  01/23/19 6:13 PM Medical Oncology and Hematology [facility]  [place], [place] [address] Tel. [phone]    Fax. [phone]    I, Kristin Ball am acting as a Education administrator for Chauncey Cruel, MD.   I, Lurline Del MD, have reviewed the above documentation for accuracy and completeness, and I agree with the above.

## 2019-01-23 ENCOUNTER — Encounter: Payer: Self-pay | Admitting: Physical Therapy

## 2019-01-23 ENCOUNTER — Ambulatory Visit: Payer: Medicare Other | Attending: Surgery | Admitting: Physical Therapy

## 2019-01-23 ENCOUNTER — Other Ambulatory Visit: Payer: Self-pay

## 2019-01-23 ENCOUNTER — Ambulatory Visit
Admission: RE | Admit: 2019-01-23 | Discharge: 2019-01-23 | Disposition: A | Discharge status: Home / Self Care | Payer: Medicare Other | Source: Ambulatory Visit | Attending: Radiation Oncology | Admitting: Radiation Oncology

## 2019-01-23 ENCOUNTER — Inpatient Hospital Stay: Payer: Medicare Other | Attending: Oncology | Admitting: Oncology

## 2019-01-23 ENCOUNTER — Inpatient Hospital Stay: Payer: Medicare Other

## 2019-01-23 ENCOUNTER — Encounter: Payer: Self-pay | Admitting: *Deleted

## 2019-01-23 ENCOUNTER — Ambulatory Visit: Payer: Self-pay | Admitting: Surgery

## 2019-01-23 ENCOUNTER — Encounter: Payer: Self-pay | Admitting: Oncology

## 2019-01-23 VITALS — BP 140/67 | HR 76 | Temp 98.7°F | Resp 18 | Ht 59.75 in | Wt 160.1 lb

## 2019-01-23 DIAGNOSIS — R293 Abnormal posture: Secondary | ICD-10-CM | POA: Diagnosis present

## 2019-01-23 DIAGNOSIS — C50812 Malignant neoplasm of overlapping sites of left female breast: Secondary | ICD-10-CM | POA: Insufficient documentation

## 2019-01-23 DIAGNOSIS — Z800 Family history of malignant neoplasm of digestive organs: Secondary | ICD-10-CM

## 2019-01-23 DIAGNOSIS — Z170 Estrogen receptor positive status [ER+]: Secondary | ICD-10-CM

## 2019-01-23 DIAGNOSIS — C50912 Malignant neoplasm of unspecified site of left female breast: Secondary | ICD-10-CM

## 2019-01-23 DIAGNOSIS — Z809 Family history of malignant neoplasm, unspecified: Secondary | ICD-10-CM

## 2019-01-23 DIAGNOSIS — Z8042 Family history of malignant neoplasm of prostate: Secondary | ICD-10-CM | POA: Diagnosis not present

## 2019-01-23 DIAGNOSIS — Z803 Family history of malignant neoplasm of breast: Secondary | ICD-10-CM | POA: Diagnosis not present

## 2019-01-23 DIAGNOSIS — C50412 Malignant neoplasm of upper-outer quadrant of left female breast: Secondary | ICD-10-CM

## 2019-01-23 DIAGNOSIS — C50212 Malignant neoplasm of upper-inner quadrant of left female breast: Secondary | ICD-10-CM

## 2019-01-23 LAB — CMP (CANCER CENTER ONLY)
ALT: 23 U/L (ref 0–44)
ANION GAP: 13 (ref 5–15)
AST: 16 U/L (ref 15–41)
Albumin: 4.3 g/dL (ref 3.5–5.0)
Alkaline Phosphatase: 87 U/L (ref 38–126)
BUN: 16 mg/dL (ref 8–23)
CO2: 26 mmol/L (ref 22–32)
Calcium: 9.8 mg/dL (ref 8.9–10.3)
Chloride: 108 mmol/L (ref 98–111)
Creatinine: 0.97 mg/dL (ref 0.44–1.00)
GFR, EST NON AFRICAN AMERICAN: 58 mL/min — AB (ref >60)
GFR, Est AFR Am: >60 mL/min (ref >60)
Glucose, Bld: 138 mg/dL — ABNORMAL HIGH (ref 70–99)
Potassium: 5.3 mmol/L — ABNORMAL HIGH (ref 3.5–5.1)
Sodium: 147 mmol/L — ABNORMAL HIGH (ref 135–145)
Total Bilirubin: 0.6 mg/dL (ref 0.3–1.2)
Total Protein: 7.5 g/dL (ref 6.5–8.1)

## 2019-01-23 LAB — CBC WITH DIFFERENTIAL (CANCER CENTER ONLY)
Abs Immature Granulocytes: 0.03 10*3/uL (ref 0.00–0.07)
Basophils Absolute: 0.1 10*3/uL (ref 0.0–0.1)
Basophils Relative: 1 %
Eosinophils Absolute: 0.2 10*3/uL (ref 0.0–0.5)
Eosinophils Relative: 2 %
HCT: 47.7 % — ABNORMAL HIGH (ref 36.0–46.0)
Hemoglobin: 14.7 g/dL (ref 12.0–15.0)
IMMATURE GRANULOCYTES: 0 %
Lymphocytes Relative: 35 %
Lymphs Abs: 2.5 10*3/uL (ref 0.7–4.0)
MCH: 29.7 pg (ref 26.0–34.0)
MCHC: 30.8 g/dL (ref 30.0–36.0)
MCV: 96.4 fL (ref 80.0–100.0)
Monocytes Absolute: 0.5 10*3/uL (ref 0.1–1.0)
Monocytes Relative: 7 %
NEUTROS PCT: 55 %
Neutro Abs: 3.9 10*3/uL (ref 1.7–7.7)
Platelet Count: 262 10*3/uL (ref 150–400)
RBC: 4.95 MIL/uL (ref 3.87–5.11)
RDW: 13 % (ref 11.5–15.5)
WBC Count: 7.2 10*3/uL (ref 4.0–10.5)
nRBC: 0 % (ref 0.0–0.2)

## 2019-01-23 NOTE — H&P (Signed)
[place] Documented: 01/23/2019 7:25 AM Location: [facility] Patient #: [id number] DOB: 02/07/1946 Divorced / Language: Cleophus Molt / Race: White Female  History of Present Illness Marcello Moores A. Quintana Canelo MD; 01/23/2019 10:49 AM) Patient words: Pt sent at the request of Dr Dema Severin for abnormal screening mammogram which showed 3 lesions in the left breast IDC ER POS PR NEG AND THE THIRD WHICH IS MORPHOLGICALLY DIFFERENT. She denies history of breast pain, mass discharge and had 2 benign right breast biopy. Family history of prostate and breast cancer.  The patient is a 73 year old female.   Past Surgical History Tawni Pummel, RN; 01/23/2019 7:25 AM) Appendectomy Mastectomy Right. Shoulder Surgery Bilateral.  Diagnostic Studies History Tawni Pummel, RN; 01/23/2019 7:25 AM) Colonoscopy never Mammogram within last year Pap Smear 1-5 years ago  Medication History Tawni Pummel, RN; 01/23/2019 7:25 AM) Medications Reconciled  Social History Tawni Pummel, RN; 01/23/2019 7:25 AM) Alcohol use Occasional alcohol use. Caffeine use Coffee. No drug use Tobacco use Never smoker.  Family History Tawni Pummel, RN; 01/23/2019 7:25 AM) Alcohol Abuse Father. Arthritis Mother. Breast Cancer Family Members In General. Colon Cancer Brother. Colon Polyps Brother. Diabetes Mellitus Father. Heart Disease Mother, Sister. Hypertension Father, Mother. Melanoma Father. Prostate Cancer Father. Thyroid problems Mother.  Pregnancy / Birth History Tawni Pummel, RN; 01/23/2019 7:25 AM) Age at menarche 59 years. Age of menopause <45 Contraceptive History Oral contraceptives. Gravida 0 Para 0  Other Problems Tawni Pummel, RN; 01/23/2019 7:25 AM) Asthma Depression Gastroesophageal Reflux Disease General anesthesia - complications Heart murmur High blood pressure Hypercholesterolemia Lump In Breast Migraine Headache     Review of  Systems (Kato Wieczorek A. Stephfon Bovey MD; 01/23/2019 10:53 AM) General Not Present- Appetite Loss, Chills, Fatigue, Fever, Night Sweats, Weight Gain and Weight Loss. Skin Not Present- Change in Wart/Mole, Dryness, Hives, Jaundice, New Lesions, Non-Healing Wounds, Rash and Ulcer. HEENT Present- Seasonal Allergies. Not Present- Earache, Hearing Loss, Hoarseness, Nose Bleed, Oral Ulcers, Ringing in the Ears, Sinus Pain, Sore Throat, Visual Disturbances, Wears glasses/contact lenses and Yellow Eyes. Respiratory Present- Wheezing. Not Present- Bloody sputum, Chronic Cough, Difficulty Breathing and Snoring. Cardiovascular Not Present- Chest Pain, Difficulty Breathing Lying Down, Leg Cramps, Palpitations, Rapid Heart Rate, Shortness of Breath and Swelling of Extremities. Gastrointestinal Not Present- Abdominal Pain, Bloating, Bloody Stool, Change in Bowel Habits, Chronic diarrhea, Constipation, Difficulty Swallowing, Excessive gas, Gets full quickly at meals, Hemorrhoids, Indigestion, Nausea, Rectal Pain and Vomiting. Female Genitourinary Not Present- Frequency, Nocturia, Painful Urination, Pelvic Pain and Urgency. Musculoskeletal Not Present- Back Pain, Joint Pain, Joint Stiffness, Muscle Pain, Muscle Weakness and Swelling of Extremities. Neurological Not Present- Decreased Memory, Fainting, Headaches, Numbness, Seizures, Tingling, Tremor, Trouble walking and Weakness. Psychiatric Present- Anxiety. Not Present- Bipolar, Change in Sleep Pattern, Depression, Fearful and Frequent crying. Endocrine Not Present- Cold Intolerance, Excessive Hunger, Hair Changes, Heat Intolerance, Hot flashes and New Diabetes. Hematology Present- Easy Bruising. Not Present- Blood Thinners, Excessive bleeding, Gland problems, HIV and Persistent Infections. All other systems negative   Physical Exam (Charrie Mcconnon A. Bellah Alia MD; 01/23/2019 10:50 AM)  General Mental Status-Alert. General Appearance-Consistent with stated age. Hydration-Well  hydrated. Voice-Normal.  Head and Neck Head-normocephalic, atraumatic with no lesions or palpable masses. Trachea-midline. Thyroid Gland Characteristics - normal size and consistency.  Eye Eyeball - Bilateral-Extraocular movements intact. Sclera/Conjunctiva - Bilateral-No scleral icterus.  Chest and Lung Exam Chest and lung exam reveals -quiet, even and easy respiratory effort with no use of accessory muscles and on auscultation, normal breath sounds, no adventitious sounds  and normal vocal resonance. Inspection Chest Wall - Normal. Back - normal.  Breast Note: BRUISING LEFT BREAST NO RIGHT BREAST MASS NO LEFT BREAST MASS  Cardiovascular Cardiovascular examination reveals -normal heart sounds, regular rate and rhythm with no murmurs and normal pedal pulses bilaterally.  Neurologic Neurologic evaluation reveals -alert and oriented x 3 with no impairment of recent or remote memory. Mental Status-Normal.  Lymphatic Head & Neck  General Head & Neck Lymphatics: Bilateral - Description - Normal. Axillary  General Axillary Region: Bilateral - Description - Normal. Tenderness - Non Tender.    Assessment & Plan (Eulises Kijowski A. Ramey Ketcherside MD; 01/23/2019 10:53 AM)  BREAST CANCER, LEFT (C50.912) Impression: Pt has opted for left simple mastectomy and SLN mapping needs genetics  Discussed treatment options for breast cancer to include breast conservation vs mastectomy with reconstruction. Pt has decided on mastectomy. Risk include bleeding, infection, flap necrosis, pain, numbness, recurrence, hematoma, other surgery needs. Pt understands and agrees to proceed. Risk of sentinel lymph node mapping include bleeding, SWELLING, infection, lymphedema, shoulder pain. stiffness, dye allergy. cosmetic deformity , blood clots, death, need for more surgery. Pt agres to proceed.   MALIGNANT NEOPLASM OF CENTRAL PORTION OF LEFT BREAST IN FEMALE, ESTROGEN RECEPTOR POSITIVE  (C50.112)  Current Plans You are being scheduled for surgery- Our schedulers will call you.  You should hear from our office's scheduling department within 5 working days about the location, date, and time of surgery. We try to make accommodations for patient's preferences in scheduling surgery, but sometimes the OR schedule or the surgeon's schedule prevents [place] from making those accommodations.  If you have not heard from our office [phone]) in 5 working days, call the office and ask for your surgeon's nurse.  If you have other questions about your diagnosis, plan, or surgery, call the office and ask for your surgeon's nurse.  Pt Education - [facility] Breast Cancer Information Given - [employer] "Breast Journey" Package Pt Education - [facility] Mastectomy HCI Pt Education - ABC (After Breast Cancer) Class Info: discussed with patient and provided information.

## 2019-01-23 NOTE — Therapy (Signed)
[place], [place], [address] Phone: [phone]   Fax:  [phone]  Physical Therapy Evaluation  Patient Details  Name: Kristin Ball MRN: [id number] Date of Birth: 10-26-1946 Referring Provider (PT): Dr. Erroll Luna   Encounter Date: 01/23/2019  PT End of Session - 01/23/19 0956    Visit Number  1    Number of Visits  2    Date for PT Re-Evaluation  03/20/19    PT Start Time  0920    PT Stop Time  0946    PT Time Calculation (min)  26 min    Activity Tolerance  Patient tolerated treatment well    Behavior During Therapy  [facility] for tasks assessed/performed       Past Medical History:  Diagnosis Date  . Allergic rhinitis   . Anxiety   . Asthma   . Bronchitis   . DDD (degenerative disc disease)   . Depression   . Gastroesophageal reflux   . Goiter 2012  . Hypercholesteremia   . Hypertension   . Multiple thyroid nodules   . Osteopenia     Past Surgical History:  Procedure Laterality Date  . APPENDECTOMY    . BREAST BIOPSY     X3 benign  . left shoulder surgery  02/2011  . right shoulder surgery    . TOTAL THYROIDECTOMY  10/06/11  . tugal ligation      There were no vitals filed for this visit.   Subjective Assessment - 01/23/19 0948    Subjective  Patient reports she is here today to be seen by her medical team for her newly diagnosed left breast cancer.    Pertinent History  Patient was diagnosed on 01/02/2019 with left invasive ductal carcinoma breast cancer. There are multiple masses in the upper outer quadrant measuring 3m, 536m and 2.6 cm. One area is ER positive, PR negative and HER2 negative with a Ki67 of 5% and another area is ER/PR positive and HER2 negative with a Ki67 of 10%. She has asthma which limits outdoor activity and a history of a right shoulder surgery in 2008.    Patient Stated Goals  Reduce lymphedema risk and learn post op shoulder ROM HEP    Currently in  Pain?  No/denies         [facility] 01/23[facility]19 0001      Assessment   Medical Diagnosis  Left breast cancer    Referring Provider (PT)  Dr. ThMarcello Mooresornett    Onset Date/Surgical Date  01/02/19    Hand Dominance  Right    Prior Therapy  none      Precautions   Precautions  Other (comment)    Precaution Comments  active cancer      Restrictions   Weight Bearing Restrictions  No      Balance Screen   Has the patient fallen in the past 6 months  No    Has the patient had a decrease in activity level because of a fear of falling?   No    Is the patient reluctant to leave their home because of a fear of falling?   No      Home EnSocial workerPrivate residence    Living Arrangements  Alone    Available Help at Discharge  Family      Prior Function   Level of InNewcastleRetired  Leisure  She walks daily around her house for 30 min      Cognition   Overall Cognitive Status  Within Functional Limits for tasks assessed      Posture/Postural Control   Posture/Postural Control  Postural limitations    Postural Limitations  Rounded Shoulders;Forward head;Increased thoracic kyphosis      ROM / Strength   AROM / PROM / Strength  AROM;Strength      AROM   AROM Assessment Site  Shoulder;Cervical    Right/Left Shoulder  Right;Left    Right Shoulder Extension  57 Degrees    Right Shoulder Flexion  156 Degrees    Right Shoulder ABduction  166 Degrees    Right Shoulder Internal Rotation  65 Degrees    Right Shoulder External Rotation  86 Degrees    Left Shoulder Extension  68 Degrees    Left Shoulder Flexion  160 Degrees    Left Shoulder ABduction  169 Degrees    Left Shoulder Internal Rotation  70 Degrees    Left Shoulder External Rotation  86 Degrees    Cervical Flexion  WNL    Cervical Extension  WNL    Cervical - Right Side Bend  WNL    Cervical - Left Side Bend  WNL    Cervical - Right Rotation  WNL    Cervical -  Left Rotation  WNL      Strength   Overall Strength  Within functional limits for tasks performed        LYMPHEDEMA/ONCOLOGY QUESTIONNAIRE - 01/23/19 0954      Type   Cancer Type  Left breast cancer      Lymphedema Assessments   Lymphedema Assessments  Upper extremities      Right Upper Extremity Lymphedema   10 cm Proximal to Olecranon Process  35.2 cm    Olecranon Process  26.8 cm    10 cm Proximal to Ulnar Styloid Process  25.2 cm    Just Proximal to Ulnar Styloid Process  15.4 cm    Across Hand at [employer]  19.2 cm    At [place] of 2nd Digit  5.8 cm      Left Upper Extremity Lymphedema   10 cm Proximal to Olecranon Process  35.2 cm    Olecranon Process  27.2 cm    10 cm Proximal to Ulnar Styloid Process  23.7 cm    Just Proximal to Ulnar Styloid Process  15.4 cm    Across Hand at [employer]  19 cm    At [place] of 2nd Digit  6 cm          Quick Dash - 01/23/19 0001    Open a tight or new jar  No difficulty    Do heavy household chores (wash walls, wash floors)  No difficulty    Carry a shopping bag or briefcase  No difficulty    Wash your back  No difficulty    Use a knife to cut food  No difficulty    Recreational activities in which you take some force or impact through your arm, shoulder, or hand (golf, hammering, tennis)  No difficulty    During the past week, to what extent has your arm, shoulder or hand problem interfered with your normal social activities with family, friends, neighbors, or groups?  Not at all    During the past week, to what extent has your arm, shoulder or hand problem limited your work or other  regular daily activities  Not at all    Arm, shoulder, or hand pain.  None    Tingling (pins and needles) in your arm, shoulder, or hand  None    Difficulty Sleeping  No difficulty    DASH Score  0 %        Objective measurements completed on examination: See above findings.     Patient was instructed today in a home exercise program  today for post op shoulder range of motion. These included active assist shoulder flexion in sitting, scapular retraction, wall walking with shoulder abduction, and hands behind head external rotation.  She was encouraged to do these twice a day, holding 3 seconds and repeating 5 times when permitted by her physician.        PT Education - 01/23/19 0955    Education Details  Lymphedema risk reduction and post op shoulder ROM HEP    Person(s) Educated  Patient   Friend   Methods  Explanation;Demonstration;Handout    Comprehension  Returned demonstration;Verbalized understanding          PT Long Term Goals - 01/23/19 0959      PT LONG TERM GOAL #1   Title  Patient will be able to demonstrate she has regained full shoulder ROM and function post operatively compared to baseline measurements.    Time  [facility] Goals - 01/23/19 0959      Patient will be able to verbalize understanding of pertinent lymphedema risk reduction practices relevant to her diagnosis specifically related to skin care.   Time  1    Period  Days    Status  Achieved      Patient will be able to return demonstrate and/or verbalize understanding of the post-op home exercise program related to regaining shoulder range of motion.   Time  1    Status  Achieved      Patient will be able to verbalize understanding of the importance of attending the postoperative After Breast Cancer Class for further lymphedema risk reduction education and therapeutic exercise.   Time  1    Period  Days    Status  Achieved            Plan - 01/23/19 0956    Clinical Impression Statement  Patient was diagnosed on 01/02/2019 with left invasive ductal carcinoma breast cancer. There are multiple masses in the upper outer quadrant measuring 41m, 590m and 2.6 cm. One area is ER positive, PR negative and HER2 negative with a Ki67 of 5% and another area is ER/PR positive and HER2 negative  with a Ki67 of 10%. She has asthma which limits outdoor activity and a history of a right shoulder surgery in 2008. Her multidisciplinary medical team met prior to her assessments to determine a recommended treatment plan. She is planning to have a left mastectomy and sentinel node biopsy followed by Oncotype testing and anti-estrogen therapy. She will benefit from a post op PT reassessment to determine needs.    History and Personal Factors relevant to plan of care:  Asthma; previous right shoulder surgery; lives alone    Clinical Presentation  Stable    Clinical Presentation due to:  Stable condition    Clinical Decision Making  Low    Rehab Potential  Excellent    Clinical Impairments Affecting Rehab Potential  Lives alone    PT Frequency  --  Eval and 1 f/u visit   PT Treatment/Interventions  ADLs/Self Care Home Management;Patient/family education;Therapeutic exercise    PT Next Visit Plan  Will reassess and f/u 3-4 weeks post op    PT Home Exercise Plan  Post op shoulder ROM HEP    Consulted and Agree with Plan of Care  Patient;Other (Comment)   Friend      Patient will benefit from skilled therapeutic intervention in order to improve the following deficits and impairments:  Decreased range of motion, Impaired UE functional use, Pain, Decreased knowledge of precautions, Postural dysfunction  Visit Diagnosis: Malignant neoplasm of upper-outer quadrant of left breast in female, estrogen receptor positive ([place]) - Plan: PT plan of care cert/re-cert  Abnormal posture - Plan: PT plan of care cert/re-cert   Patient will follow up at outpatient cancer rehab 3-4 weeks following surgery.  If the patient requires physical therapy at that time, a specific plan will be dictated and sent to the referring physician for approval. The patient was educated today on appropriate basic range of motion exercises to begin post operatively and the importance of attending the After Breast Cancer class  following surgery.  Patient was educated today on lymphedema risk reduction practices as it pertains to recommendations that will benefit the patient immediately following surgery.  She verbalized good understanding.      Problem List Patient Active Problem List   Diagnosis Date Noted  . Malignant neoplasm of upper-outer quadrant of left breast in female, estrogen receptor positive ([place]) 01/21/2019  . Malignant neoplasm of upper-inner quadrant of left breast in female, estrogen receptor positive ([place] [place]) 01/21/2019  . Cough variant asthma 04/09/2015  . Dyspnea on exertion 02/26/2015  . Hypothyroidism, postsurgical 12/19/2011  . Multinodular goiter (nontoxic), with compressive symptoms 09/13/2011   Annia Friendly, PT 01/23/19 10:03 AM  [place], [place], [address] Phone: [phone]   Fax:  [phone]  Name: Kristin Ball MRN: [id number] Date of Birth: 1946-10-16

## 2019-01-23 NOTE — Patient Instructions (Signed)

## 2019-01-23 NOTE — Progress Notes (Signed)
Nutrition Assessment  Reason for Assessment:  Pt seen in Breast Clinic  ASSESSMENT:   73 year old female with new diagnosis of breast cancer.  Past medical history of HTN.    Patient reports normal appetite  Medications:  reviewed  Labs: Na 147, K 5.3, glucose 138  Anthropometrics:   Height: 4'11 inches Weight: 160 lb BMI: 31   NUTRITION DIAGNOSIS: Food and nutrition related knowledge deficit related to new diagnosis of breast cancer as evidenced by no prior need for nutrition related information.  INTERVENTION:   Discussed and provided packet of information regarding nutritional tips for breast cancer patients.  Questions answered.  Teachback method used.  Contact information provided and patient knows to contact me with questions/concerns.    MONITORING, EVALUATION, and GOAL: Pt will consume a healthy plant based diet to maintain lean body mass throughout treatment.   Kristin Ball B. Zenia Resides, [place], [place] Registered Dietitian [phone] (pager)

## 2019-01-23 NOTE — Progress Notes (Signed)
Clinical Social Work [place] Psychosocial Distress Screening [place]  Patient completed distress screening protocol and scored a 10 on the Psychosocial Distress Thermometer which indicates severe distress. Clinical Social Worker met with patient and patients family in [facility] to assess for distress and other psychosocial needs. Patient stated she was feeling overwhelmed but felt "better" after meeting with the treatment team and getting more information on her treatment plan. CSW and patient discussed common feeling and emotions when being diagnosed with cancer, and the importance of support during treatment. CSW informed patient of the support team and support services at [facility]. CSW provided contact information and encouraged patient to call with any questions or concerns.  ON[facility]N DISTRESS SCREENING 01/23/2019  Screening Type Initial Screening  Distress experienced in past week (1-10) 10  Emotional problem type Nervousness/Anxiety;Adjusting to illness     Johnnye Lana, MSW, LCSW, OSW-C Clinical Social Worker [facility] [phone]

## 2019-01-23 NOTE — H&P (View-Only) (Signed)
[place] Documented: 01/23/2019 7:25 AM Location: [facility] Patient #: [id number] DOB: 1946/02/26 Divorced / Language: Cleophus Molt / Race: White Female  History of Present Illness Marcello Moores A. Oanh Devivo MD; 01/23/2019 10:49 AM) Patient words: Pt sent at the request of Dr Dema Severin for abnormal screening mammogram which showed 3 lesions in the left breast IDC ER POS PR NEG AND THE THIRD WHICH IS MORPHOLGICALLY DIFFERENT. She denies history of breast pain, mass discharge and had 2 benign right breast biopy. Family history of prostate and breast cancer.  The patient is a 73 year old female.   Past Surgical History Tawni Pummel, RN; 01/23/2019 7:25 AM) Appendectomy Mastectomy Right. Shoulder Surgery Bilateral.  Diagnostic Studies History Tawni Pummel, RN; 01/23/2019 7:25 AM) Colonoscopy never Mammogram within last year Pap Smear 1-5 years ago  Medication History Tawni Pummel, RN; 01/23/2019 7:25 AM) Medications Reconciled  Social History Tawni Pummel, RN; 01/23/2019 7:25 AM) Alcohol use Occasional alcohol use. Caffeine use Coffee. No drug use Tobacco use Never smoker.  Family History Tawni Pummel, RN; 01/23/2019 7:25 AM) Alcohol Abuse Father. Arthritis Mother. Breast Cancer Family Members In General. Colon Cancer Brother. Colon Polyps Brother. Diabetes Mellitus Father. Heart Disease Mother, Sister. Hypertension Father, Mother. Melanoma Father. Prostate Cancer Father. Thyroid problems Mother.  Pregnancy / Birth History Tawni Pummel, RN; 01/23/2019 7:25 AM) Age at menarche 39 years. Age of menopause <45 Contraceptive History Oral contraceptives. Gravida 0 Para 0  Other Problems Tawni Pummel, RN; 01/23/2019 7:25 AM) Asthma Depression Gastroesophageal Reflux Disease General anesthesia - complications Heart murmur High blood pressure Hypercholesterolemia Lump In Breast Migraine Headache     Review of  Systems (Trishia Cuthrell A. Arieonna Medine MD; 01/23/2019 10:53 AM) General Not Present- Appetite Loss, Chills, Fatigue, Fever, Night Sweats, Weight Gain and Weight Loss. Skin Not Present- Change in Wart/Mole, Dryness, Hives, Jaundice, New Lesions, Non-Healing Wounds, Rash and Ulcer. HEENT Present- Seasonal Allergies. Not Present- Earache, Hearing Loss, Hoarseness, Nose Bleed, Oral Ulcers, Ringing in the Ears, Sinus Pain, Sore Throat, Visual Disturbances, Wears glasses/contact lenses and Yellow Eyes. Respiratory Present- Wheezing. Not Present- Bloody sputum, Chronic Cough, Difficulty Breathing and Snoring. Cardiovascular Not Present- Chest Pain, Difficulty Breathing Lying Down, Leg Cramps, Palpitations, Rapid Heart Rate, Shortness of Breath and Swelling of Extremities. Gastrointestinal Not Present- Abdominal Pain, Bloating, Bloody Stool, Change in Bowel Habits, Chronic diarrhea, Constipation, Difficulty Swallowing, Excessive gas, Gets full quickly at meals, Hemorrhoids, Indigestion, Nausea, Rectal Pain and Vomiting. Female Genitourinary Not Present- Frequency, Nocturia, Painful Urination, Pelvic Pain and Urgency. Musculoskeletal Not Present- Back Pain, Joint Pain, Joint Stiffness, Muscle Pain, Muscle Weakness and Swelling of Extremities. Neurological Not Present- Decreased Memory, Fainting, Headaches, Numbness, Seizures, Tingling, Tremor, Trouble walking and Weakness. Psychiatric Present- Anxiety. Not Present- Bipolar, Change in Sleep Pattern, Depression, Fearful and Frequent crying. Endocrine Not Present- Cold Intolerance, Excessive Hunger, Hair Changes, Heat Intolerance, Hot flashes and New Diabetes. Hematology Present- Easy Bruising. Not Present- Blood Thinners, Excessive bleeding, Gland problems, HIV and Persistent Infections. All other systems negative   Physical Exam (Ralphael Southgate A. Taygen Acklin MD; 01/23/2019 10:50 AM)  General Mental Status-Alert. General Appearance-Consistent with stated age. Hydration-Well  hydrated. Voice-Normal.  Head and Neck Head-normocephalic, atraumatic with no lesions or palpable masses. Trachea-midline. Thyroid Gland Characteristics - normal size and consistency.  Eye Eyeball - Bilateral-Extraocular movements intact. Sclera/Conjunctiva - Bilateral-No scleral icterus.  Chest and Lung Exam Chest and lung exam reveals -quiet, even and easy respiratory effort with no use of accessory muscles and on auscultation, normal breath sounds, no adventitious sounds  and normal vocal resonance. Inspection Chest Wall - Normal. Back - normal.  Breast Note: BRUISING LEFT BREAST NO RIGHT BREAST MASS NO LEFT BREAST MASS  Cardiovascular Cardiovascular examination reveals -normal heart sounds, regular rate and rhythm with no murmurs and normal pedal pulses bilaterally.  Neurologic Neurologic evaluation reveals -alert and oriented x 3 with no impairment of recent or remote memory. Mental Status-Normal.  Lymphatic Head & Neck  General Head & Neck Lymphatics: Bilateral - Description - Normal. Axillary  General Axillary Region: Bilateral - Description - Normal. Tenderness - Non Tender.    Assessment & Plan (Darwin Guastella A. Timohty Renbarger MD; 01/23/2019 10:53 AM)  BREAST CANCER, LEFT (C50.912) Impression: Pt has opted for left simple mastectomy and SLN mapping needs genetics  Discussed treatment options for breast cancer to include breast conservation vs mastectomy with reconstruction. Pt has decided on mastectomy. Risk include bleeding, infection, flap necrosis, pain, numbness, recurrence, hematoma, other surgery needs. Pt understands and agrees to proceed. Risk of sentinel lymph node mapping include bleeding, SWELLING, infection, lymphedema, shoulder pain. stiffness, dye allergy. cosmetic deformity , blood clots, death, need for more surgery. Pt agres to proceed.   MALIGNANT NEOPLASM OF CENTRAL PORTION OF LEFT BREAST IN FEMALE, ESTROGEN RECEPTOR POSITIVE  (C50.112)  Current Plans You are being scheduled for surgery- Our schedulers will call you.  You should hear from our office's scheduling department within 5 working days about the location, date, and time of surgery. We try to make accommodations for patient's preferences in scheduling surgery, but sometimes the OR schedule or the surgeon's schedule prevents [place] from making those accommodations.  If you have not heard from our office [phone]) in 5 working days, call the office and ask for your surgeon's nurse.  If you have other questions about your diagnosis, plan, or surgery, call the office and ask for your surgeon's nurse.  Pt Education - [facility] Breast Cancer Information Given - [employer] "Breast Journey" Package Pt Education - [facility] Mastectomy HCI Pt Education - ABC (After Breast Cancer) Class Info: discussed with patient and provided information.

## 2019-01-23 NOTE — Progress Notes (Signed)
Radiation Oncology         [phone] ________________________________  Name: ILYA Ball        MRN: [id number]  Date of Service: 01/23/2019 DOB: February 18, 1946  CC:Kristin Stains, MD  Kristin Luna, MD     REFERRING PHYSICIAN: Erroll Luna, MD   DIAGNOSIS: The encounter diagnosis was Malignant neoplasm of upper-outer quadrant of left breast in female, estrogen receptor positive ([place]).   HISTORY OF PRESENT ILLNESS: Kristin Ball is a 73 y.o. female seen in the multidisciplinary breast clinic for a new diagnosis of multifocal left breast cancer. The patient was noted to have a screening detected abnormality and during screening recall, there were concerns for multifocal lesions in the left breast. At 12:00 there was a 2.6 x 1 cm mass as well at 9:00 there was a 5 mm  Lesion and at 3:00 there was a 7 mm lesion. All three sites measured a total distance of 8 cm. She underwent three biopsies. 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 Her 9:00 biopsy revealed a grade 1 invasive ductal carcinoma, ER/PR positive, and HER2 was negative and her Ki 67 was 10%. She comes today to discuss treatment recommendations for her cancer    PREVIOUS RADIATION THERAPY: No   PAST MEDICAL HISTORY:  Past Medical History:  Diagnosis Date  . Allergic rhinitis   . Anxiety   . Asthma   . Bronchitis   . DDD (degenerative disc disease)   . Depression   . Gastroesophageal reflux   . Goiter 2012  . Hypercholesteremia   . Hypertension   . Multiple thyroid nodules   . Osteopenia        PAST SURGICAL HISTORY: Past Surgical History:  Procedure Laterality Date  . APPENDECTOMY    . BREAST BIOPSY     X3 benign  . left shoulder surgery  02/2011  . right shoulder surgery    . TOTAL THYROIDECTOMY  10/06/11  . tugal ligation       FAMILY HISTORY:  Family History  Problem Relation Age of  Onset  . Hypertension Father   . Diabetes Father   . Hyperlipidemia Father   . Cancer Father   . Heart attack Mother   . Hyperlipidemia Mother      SOCIAL HISTORY:  reports that she has never smoked. She has never used smokeless tobacco. She reports current alcohol use of about 1.0 standard drinks of alcohol per week. She reports that she does not use drugs. The patient is divorced and lives in [place]. She is accompanied by her friend.   ALLERGIES: Darvon; Ivp dye [iodinated diagnostic agents]; and Statins   MEDICATIONS:  Current Outpatient Medications  Medication Sig Dispense Refill  . albuterol (PROVENTIL HFA;VENTOLIN HFA) 108 (90 BASE) MCG/ACT inhaler Inhale 2 puffs into the lungs every 4 (four) hours as needed. For shortness of breath     . amLODipine (NORVASC) 5 MG tablet Take 1 tablet by mouth daily.  0  . cholecalciferol (VITAMIN D) 1000 UNITS tablet Take 1,000 Units by mouth daily.    . famotidine (PEPCID) 20 MG tablet One at bedtime 30 tablet 11  . fluticasone (FLONASE) 50 MCG/ACT nasal spray Place 2 sprays into both nostrils 2 (two) times daily.  3  . irbesartan (AVAPRO) 300 MG tablet Take 1 tablet by mouth daily.  1  . montelukast (SINGULAIR) 10 MG tablet Daily.    Marland Kitchen  Multiple Vitamin (MULTIVITAMIN) tablet Take 1 tablet by mouth daily.    . SYMBICORT 160-4.5 MCG/ACT inhaler Inhale 2 puffs into the lungs 2 (two) times daily.  3  . SYNTHROID 88 MCG tablet Take 88 mcg by mouth daily before breakfast.      No current facility-administered medications for this visit.      REVIEW OF SYSTEMS: On review of systems, the patient reports that she is doing well overall. No other complaints are noted.   PHYSICAL EXAM:  Wt Readings from Last 3 Encounters:  11/22/17 160 lb (72.6 kg)  04/09/15 157 lb (71.2 kg)  02/26/15 153 lb (69.4 kg)   Temp Readings from Last 3 Encounters:  03/22/12 97.4 F (36.3 C) (Temporal)  12/19/11 97.8 F (36.6 C) (Temporal)  11/03/11 97.7 F  (36.5 C) (Temporal)   BP Readings from Last 3 Encounters:  11/22/17 130/78  04/09/15 126/74  02/26/15 (!) 142/80   Pulse Readings from Last 3 Encounters:  11/22/17 91  04/09/15 87  02/26/15 80    In general this is a well appearing caucasian female in no acute distress. She is alert and oriented x4 and appropriate throughout the examination. HEENT reveals that the patient is normocephalic, atraumatic. EOMs are intact. Skin is intact without any evidence of gross lesions.Cardiopulmonary assessment is negative for acute distress and she exhibits normal effort. Breast exam is deferred.  ECOG = 0  0 - Asymptomatic (Fully active, able to carry on all predisease activities without restriction)  1 - Symptomatic but completely ambulatory (Restricted in physically strenuous activity but ambulatory and able to carry out work of a light or sedentary nature. For example, light housework, office work)  2 - Symptomatic, <50% in bed during the day (Ambulatory and capable of all self care but unable to carry out any work activities. Up and about more than 50% of waking hours)  3 - Symptomatic, >50% in bed, but not bedbound (Capable of only limited self-care, confined to bed or chair 50% or more of waking hours)  4 - Bedbound (Completely disabled. Cannot carry on any self-care. Totally confined to bed or chair)  5 - Death   Eustace Pen MM, Creech RH, Tormey DC, et al. [phone]). "Toxicity and response criteria of the [facility]". [place] Oncol. 5 (6): 649-55    LABORATORY DATA:  Lab Results  Component Value Date   WBC 10.2 11/22/2017   HGB 15.8 (H) 11/22/2017   HCT 48.6 (H) 11/22/2017   MCV 95.0 11/22/2017   PLT 307.0 11/22/2017   Lab Results  Component Value Date   NA 142 09/28/2011   K 4.4 09/28/2011   CL 104 09/28/2011   CO2 27 09/28/2011   No results found for: ALT, AST, GGT, ALKPHOS, BILITOT    RADIOGRAPHY: No results found.      IMPRESSION/PLAN: 1. 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 Dr. Lisbeth Renshaw discusses the pathology findings and reviews the nature of multifocal breast disease. The consensus from the breast conference included mastectomy wtih sentinel node biopsy. Dr. Jana Hakim anticipates Oncotype Dx score to determine a role for systemic therapy.We discussed in certain circumstances postmastectomy radiotherapy may be given. Based on her situation, Dr. Lisbeth Renshaw does not anticipate radiotherapy to the breast, however we will await final pathology to determine this. She will also benefit from antiestrogen therapy. We discussed the  risks, benefits, short, and long term effects of radiotherapy, and the patient is interested in proceeding if indicated. Dr. Lisbeth Renshaw discusses the delivery and logistics of radiotherapy and anticipates a course of 6 1/2 weeks of radiotherapy, again if clinically indicated. We will see her back based on the final results of surgery. 2. Possible genetic predisposition to malignancy. The patient is a candidate for genetic testing given her personal and family history. she was offered referral and is interested. She will be set up to proceed.    The above documentation reflects my direct findings during this shared patient visit. Please see the separate note by Dr. Lisbeth Renshaw on this date for the remainder of the patient's plan of care.    Carola Rhine, PAC

## 2019-01-24 ENCOUNTER — Other Ambulatory Visit: Payer: Medicare Other

## 2019-01-28 NOTE — Pre-Procedure Instructions (Signed)
Kristin Ball  01/28/2019    Your procedure is scheduled on Thursday, February 27..  Report to [facility], Main Entrance or Entrance "A" at 7: 00 A.M.                Your surgery or procedure is scheduled for 9:00  A.M.   Call this number if you have problems the morning of surgery: [phone]  This is the number for the Pre- Surgical Desk.                For any other questions, please call [phone], Monday - Friday 8 AM - 4 PM.    Remember:  Do not eat  after midnight.  You may drink clear liquids until 6:)) AM  .  Clear liquids allowed are:   Water, Juice (non-citric and without pulp), Carbonated beverages, Clear Tea, Black Coffee only, Plain Jell-O only, Gatorade and Plain Popsicles only              Drink the Ensure Pre- Surgery drink am of surgery, be finished by 6:00 AM    Take these medicines the morning of surgery with A SIP OF WATER: amLODipine (NORVASC)  escitalopram (LEXAPRO)  levothyroxine (SYNTHROID) SYMBICORT Inhaler If needed use albuterol (PROVENTIL HFA;VENTOLIN HFA) inhaler. Please bring this inhaler with you.  My take if needed LORazepam (ATIVAN)  STOP taking Aspirin, Aspirin Products (Goody Powder, Excedrin Migraine), Ibuprofen (Advil), Naproxen (Aleve), Vitamins and Herbal Products (ie Fish Oil).  Special instructions:   [place]- Preparing For Surgery  Before surgery, you can play an important role. Because skin is not sterile, your skin needs to be as free of germs as possible. You can reduce the number of germs on your skin by washing with CHG (chlorahexidine gluconate) Soap before surgery.  CHG is an antiseptic cleaner which kills germs and bonds with the skin to continue killing germs even after washing.    Oral Hygiene is also important to reduce your risk of infection.  Remember - BRUSH YOUR TEETH THE MORNING OF SURGERY WITH YOUR REGULAR TOOTHPASTE  Please do not use if you have an allergy to CHG or antibacterial soaps. If your  skin becomes reddened/irritated stop using the CHG.  Do not shave (including legs and underarms) for at least 48 hours prior to first CHG shower. It is OK to shave your face.  Please follow these instructions carefully.   1. Shower the NIGHT BEFORE SURGERY and the MORNING OF SURGERY with CHG.   2. If you chose to wash your hair, wash your hair first as usual with your normal shampoo.  3. After you shampoo, wash your face and private area with the soap you use at home, then rinse your hair and body thoroughly to remove the shampoo and soap.  4. Use CHG as you would any other liquid soap. You can apply CHG directly to the skin and wash gently with a scrungie or a clean washcloth.   Apply the CHG Soap to your body ONLY FROM THE NECK DOWN.  Do not use on open wounds or open sores. Avoid contact with your eyes, ears, mouth and genitals (private parts).  5. Wash thoroughly, paying special attention to the area where your surgery will be performed.  6. Thoroughly rinse your body with warm water from the neck down.  7. DO NOT shower/wash with your normal soap after using and rinsing off the CHG Soap.  8. Pat yourself dry with a CLEAN  TOWEL.  9. Wear CLEAN PAJAMAS to bed the night before surgery, wear comfortable clothes the morning of surgery  10. Place CLEAN SHEETS on your bed the night of your first shower and DO NOT SLEEP WITH PETS.   Day of Surgery: Shower as written above  Do not apply any deodorants/lotions.  Please wear clean clothes to the hospital/surgery center.   Remember to brush your teeth WITH YOUR REGULAR TOOTHPASTE.             Do not wear jewelry, make-up or nail polish.  Do not wear  powders, or perfumes  Do not shave 48 hours prior to surgery.  Men may shave face and neck.  Do not bring valuables to the hospital.  [facility] is not responsible for any belongings or valuables.  Contacts, dentures or bridgework may not be worn into surgery.  Leave your suitcase in the  car.  After surgery it may be brought to your room.  For patients admitted to the hospital, discharge time will be determined by your treatment team.  Patients discharged the day of surgery will not be allowed to drive home.   Please read over the following fact sheets that you were given:  Managing Pain, Surgical Site Infection, Coughing and Deep Breathing

## 2019-01-29 ENCOUNTER — Other Ambulatory Visit: Payer: Self-pay

## 2019-01-29 ENCOUNTER — Encounter (HOSPITAL_COMMUNITY): Payer: Self-pay

## 2019-01-29 ENCOUNTER — Encounter (HOSPITAL_COMMUNITY)
Admission: RE | Admit: 2019-01-29 | Discharge: 2019-01-29 | Disposition: A | Discharge status: Home / Self Care | Payer: Medicare Other | Source: Ambulatory Visit | Attending: Surgery | Admitting: Surgery

## 2019-01-29 DIAGNOSIS — I10 Essential (primary) hypertension: Secondary | ICD-10-CM

## 2019-01-29 DIAGNOSIS — Z01818 Encounter for other preprocedural examination: Secondary | ICD-10-CM

## 2019-01-29 DIAGNOSIS — C50912 Malignant neoplasm of unspecified site of left female breast: Secondary | ICD-10-CM | POA: Diagnosis not present

## 2019-01-29 HISTORY — DX: Other specified postprocedural states: Z98.890

## 2019-01-29 HISTORY — DX: Nausea with vomiting, unspecified: R11.2

## 2019-01-29 LAB — COMPREHENSIVE METABOLIC PANEL
ALBUMIN: 4.4 g/dL (ref 3.5–5.0)
ALT: 20 U/L (ref 0–44)
ANION GAP: 8 (ref 5–15)
AST: 17 U/L (ref 15–41)
Alkaline Phosphatase: 66 U/L (ref 38–126)
BILIRUBIN TOTAL: 0.8 mg/dL (ref 0.3–1.2)
BUN: 18 mg/dL (ref 8–23)
CO2: 24 mmol/L (ref 22–32)
Calcium: 9.6 mg/dL (ref 8.9–10.3)
Chloride: 108 mmol/L (ref 98–111)
Creatinine, Ser: 0.85 mg/dL (ref 0.44–1.00)
GFR calc Af Amer: >60 mL/min (ref >60)
GFR calc non Af Amer: >60 mL/min (ref >60)
Glucose, Bld: 123 mg/dL — ABNORMAL HIGH (ref 70–99)
Potassium: 4.1 mmol/L (ref 3.5–5.1)
Sodium: 140 mmol/L (ref 135–145)
Total Protein: 7.1 g/dL (ref 6.5–8.1)

## 2019-01-29 LAB — CBC WITH DIFFERENTIAL/PLATELET
Abs Immature Granulocytes: 0.04 10*3/uL (ref 0.00–0.07)
Basophils Absolute: 0 10*3/uL (ref 0.0–0.1)
Basophils Relative: 1 %
Eosinophils Absolute: 0 10*3/uL (ref 0.0–0.5)
Eosinophils Relative: 0 %
HCT: 47.7 % — ABNORMAL HIGH (ref 36.0–46.0)
Hemoglobin: 14.9 g/dL (ref 12.0–15.0)
Immature Granulocytes: 1 %
Lymphocytes Relative: 31 %
Lymphs Abs: 2.3 10*3/uL (ref 0.7–4.0)
MCH: 30 pg (ref 26.0–34.0)
MCHC: 31.2 g/dL (ref 30.0–36.0)
MCV: 96.2 fL (ref 80.0–100.0)
Monocytes Absolute: 0.5 10*3/uL (ref 0.1–1.0)
Monocytes Relative: 7 %
NRBC: 0 % (ref 0.0–0.2)
Neutro Abs: 4.5 10*3/uL (ref 1.7–7.7)
Neutrophils Relative %: 60 %
Platelets: 248 10*3/uL (ref 150–400)
RBC: 4.96 MIL/uL (ref 3.87–5.11)
RDW: 13.2 % (ref 11.5–15.5)
WBC: 7.3 10*3/uL (ref 4.0–10.5)

## 2019-01-29 NOTE — Pre-Procedure Instructions (Signed)
Kristin Ball  01/29/2019    Your procedure is scheduled on Thursday, February 27..  Report to [facility] Entrance  "A" at 7: 00 A.M.                Your surgery or procedure is scheduled for 9:00  A.M.   Call this number if you have problems the morning of surgery: [phone]  This is the number for the Pre- Surgical Desk.                For any other questions, please call [phone], Monday - Friday 8 AM - 4 PM.   Remember:  Do not eat  after midnight.  You may drink clear liquids until 6:00 AM  .  Clear liquids allowed are:   Water, Juice (non-citric and without pulp), Carbonated beverages, Clear Tea, Black Coffee only, Plain Jell-O only, Gatorade and Plain Popsicles only                 Take these medicines the morning of surgery with A SIP OF WATER:  albuterol (PROVENTIL HFA;VENTOLIN HFA) inhaler - if needed - bring with you the day of surgery amLODipine (NORVASC)  escitalopram (LEXAPRO)  fluticasone (FLONASE)  levothyroxine (SYNTHROID) LORazepam (ATIVAN) - if needed montelukast (SINGULAIR)  SYMBICORT Inhaler - bring with you the day of surgery  STOP taking Aspirin, Aspirin Products (Goody Powder, Excedrin Migraine), Ibuprofen (Advil), Naproxen (Aleve), Vitamins and Herbal Products (ie Fish Oil).  Follow your surgeon's instructions on when to stop Asprin.  If no instructions were given by your surgeon then you will need to call the office to get those instructions.    Special instructions:   [place]- Preparing For Surgery  Before surgery, you can play an important role. Because skin is not sterile, your skin needs to be as free of germs as possible. You can reduce the number of germs on your skin by washing with CHG (chlorahexidine gluconate) Soap before surgery.  CHG is an antiseptic cleaner which kills germs and bonds with the skin to continue killing germs even after washing.    Oral Hygiene is also important to reduce your risk of infection.  Remember  - BRUSH YOUR TEETH THE MORNING OF SURGERY WITH YOUR REGULAR TOOTHPASTE  Please do not use if you have an allergy to CHG or antibacterial soaps. If your skin becomes reddened/irritated stop using the CHG.  Do not shave (including legs and underarms) for at least 48 hours prior to first CHG shower. It is OK to shave your face.  Please follow these instructions carefully.   1. Shower the NIGHT BEFORE SURGERY and the MORNING OF SURGERY with CHG.   2. If you chose to wash your hair, wash your hair first as usual with your normal shampoo.  3. After you shampoo, wash your face and private area with the soap you use at home, then rinse your hair and body thoroughly to remove the shampoo and soap.  4. Use CHG as you would any other liquid soap. You can apply CHG directly to the skin and wash gently with a scrungie or a clean washcloth.  Apply the CHG Soap to your body ONLY FROM THE NECK DOWN.  Do not use on open wounds or open sores. Avoid contact with your eyes, ears, mouth and genitals (private parts).  5. Wash thoroughly, paying special attention to the area where your surgery will be performed.  6. Thoroughly rinse your body with warm water from  the neck down.  7. DO NOT shower/wash with your normal soap after using and rinsing off the CHG Soap.  8. Pat yourself dry with a CLEAN TOWEL.  9. Wear CLEAN PAJAMAS to bed the night before surgery, wear comfortable clothes the morning of surgery  10. Place CLEAN SHEETS on your bed the night of your first shower and DO NOT SLEEP WITH PETS.  Day of Surgery: Shower as written above  Do not apply any deodorants/lotions.  Please wear clean clothes to the hospital/surgery center.   Remember to brush your teeth WITH YOUR REGULAR TOOTHPASTE.             Do not wear jewelry, make-up or nail polish.  Do not wear  powders, or perfumes  Do not shave 48 hours prior to surgery.  Men may shave face and neck.  Do not bring valuables to the hospital.  [facility] is not responsible for any belongings or valuables.  Contacts, dentures or bridgework may not be worn into surgery.  Leave your suitcase in the car.  After surgery it may be brought to your room.  For patients admitted to the hospital, discharge time will be determined by your treatment team.  Patients discharged the day of surgery will not be allowed to drive home.   Please read over the following fact sheets that you were given:  Managing Pain, Surgical Site Infection, Coughing and Deep Breathing

## 2019-01-29 NOTE — Progress Notes (Signed)
PCP - Dr. Harlan Stains Cardiologist - denies  Chest x-ray - N/A EKG -  01/29/2019 Stress Test -  denies ECHO - denies Cardiac Cath - denies  Sleep Study - denies CPAP - N/A  Blood Thinner Instructions:N/A Aspirin Instructions: N/A  Anesthesia review:  Yes, abnormal EKG  Patient denies shortness of breath, fever, cough and chest pain at PAT appointment  Patient verbalized understanding of instructions that were given to them at the PAT appointment. Patient was also instructed that they will need to review over the PAT instructions again at home before surgery.

## 2019-01-30 MED ORDER — CEFAZOLIN SODIUM-DEXTROSE 2-4 GM/100ML-% IV SOLN
2.0000 g | INTRAVENOUS | Status: AC
  Administered 2019-01-31: 2 g via INTRAVENOUS
  Filled 2019-01-30: qty 100

## 2019-01-31 ENCOUNTER — Observation Stay (HOSPITAL_COMMUNITY)
Admission: RE | Admit: 2019-01-31 | Discharge: 2019-02-01 | Disposition: A | Discharge status: Home / Self Care | Payer: Medicare Other | Attending: Surgery | Admitting: Surgery

## 2019-01-31 ENCOUNTER — Encounter (HOSPITAL_COMMUNITY)
Admission: RE | Disposition: A | Discharge status: Home / Self Care | Payer: Self-pay | Source: Home / Self Care | Attending: Surgery

## 2019-01-31 ENCOUNTER — Other Ambulatory Visit: Payer: Self-pay

## 2019-01-31 ENCOUNTER — Ambulatory Visit (HOSPITAL_COMMUNITY): Payer: Medicare Other | Admitting: Physician Assistant

## 2019-01-31 ENCOUNTER — Ambulatory Visit (HOSPITAL_COMMUNITY)
Admission: RE | Admit: 2019-01-31 | Discharge: 2019-01-31 | Disposition: A | Discharge status: Home / Self Care | Payer: Medicare Other | Source: Ambulatory Visit | Attending: Surgery | Admitting: Surgery

## 2019-01-31 ENCOUNTER — Ambulatory Visit (HOSPITAL_COMMUNITY): Payer: Medicare Other | Admitting: Anesthesiology

## 2019-01-31 ENCOUNTER — Encounter (HOSPITAL_COMMUNITY): Payer: Self-pay

## 2019-01-31 DIAGNOSIS — Z79899 Other long term (current) drug therapy: Secondary | ICD-10-CM | POA: Insufficient documentation

## 2019-01-31 DIAGNOSIS — I10 Essential (primary) hypertension: Secondary | ICD-10-CM | POA: Insufficient documentation

## 2019-01-31 DIAGNOSIS — Z170 Estrogen receptor positive status [ER+]: Secondary | ICD-10-CM | POA: Insufficient documentation

## 2019-01-31 DIAGNOSIS — Z885 Allergy status to narcotic agent status: Secondary | ICD-10-CM | POA: Insufficient documentation

## 2019-01-31 DIAGNOSIS — C50912 Malignant neoplasm of unspecified site of left female breast: Secondary | ICD-10-CM | POA: Diagnosis present

## 2019-01-31 DIAGNOSIS — C50112 Malignant neoplasm of central portion of left female breast: Secondary | ICD-10-CM | POA: Diagnosis not present

## 2019-01-31 DIAGNOSIS — Z91041 Radiographic dye allergy status: Secondary | ICD-10-CM | POA: Diagnosis not present

## 2019-01-31 DIAGNOSIS — Z01818 Encounter for other preprocedural examination: Secondary | ICD-10-CM | POA: Insufficient documentation

## 2019-01-31 DIAGNOSIS — Z888 Allergy status to other drugs, medicaments and biological substances status: Secondary | ICD-10-CM | POA: Diagnosis not present

## 2019-01-31 HISTORY — PX: SIMPLE MASTECTOMY WITH AXILLARY SENTINEL NODE BIOPSY: SHX6098

## 2019-01-31 HISTORY — PX: MASTECTOMY W/ SENTINEL NODE BIOPSY: SHX2001

## 2019-01-31 HISTORY — DX: Malignant (primary) neoplasm, unspecified: C80.1

## 2019-01-31 LAB — GLUCOSE, CAPILLARY
Glucose-Capillary: 140 mg/dL — ABNORMAL HIGH (ref 70–99)
Glucose-Capillary: 270 mg/dL — ABNORMAL HIGH (ref 70–99)
Glucose-Capillary: 224 mg/dL — ABNORMAL HIGH (ref 70–99)

## 2019-01-31 SURGERY — SIMPLE MASTECTOMY WITH AXILLARY SENTINEL NODE BIOPSY
Anesthesia: Regional | Site: Breast | Laterality: Left

## 2019-01-31 MED ORDER — FENTANYL CITRATE (PF) 100 MCG/2ML IJ SOLN
100.0000 ug | Freq: Once | INTRAMUSCULAR | Status: AC
  Administered 2019-01-31: 50 ug via INTRAVENOUS
  Filled 2019-01-31: qty 2

## 2019-01-31 MED ORDER — CEFAZOLIN SODIUM-DEXTROSE 2-4 GM/100ML-% IV SOLN
2.0000 g | Freq: Three times a day (TID) | INTRAVENOUS | Status: AC
  Administered 2019-01-31: 2 g via INTRAVENOUS
  Filled 2019-01-31: qty 100

## 2019-01-31 MED ORDER — ONDANSETRON HCL 4 MG/2ML IJ SOLN: 4.0000 mg | Freq: Four times a day (QID) | INTRAMUSCULAR | Status: DC | PRN

## 2019-01-31 MED ORDER — VITAMIN B-12 1000 MCG PO TABS
1000.0000 ug | ORAL_TABLET | Freq: Every day | ORAL | Status: DC
  Filled 2019-01-31: qty 1

## 2019-01-31 MED ORDER — ONDANSETRON 4 MG PO TBDP: 4.0000 mg | ORAL_TABLET | Freq: Four times a day (QID) | ORAL | Status: DC | PRN

## 2019-01-31 MED ORDER — SUCCINYLCHOLINE CHLORIDE 200 MG/10ML IV SOSY
PREFILLED_SYRINGE | INTRAVENOUS | Status: AC
  Filled 2019-01-31: qty 10

## 2019-01-31 MED ORDER — DEXAMETHASONE SODIUM PHOSPHATE 10 MG/ML IJ SOLN
INTRAMUSCULAR | Status: DC | PRN
  Administered 2019-01-31: 10 mg via INTRAVENOUS

## 2019-01-31 MED ORDER — ALBUTEROL SULFATE (2.5 MG/3ML) 0.083% IN NEBU: 2.5000 mg | INHALATION_SOLUTION | RESPIRATORY_TRACT | Status: DC | PRN

## 2019-01-31 MED ORDER — HYDROMORPHONE HCL 1 MG/ML IJ SOLN
INTRAMUSCULAR | Status: AC
  Filled 2019-01-31: qty 1

## 2019-01-31 MED ORDER — TRAMADOL HCL 50 MG PO TABS
50.0000 mg | ORAL_TABLET | Freq: Four times a day (QID) | ORAL | Status: DC | PRN
  Administered 2019-01-31 – 2019-02-01 (×2): 50 mg via ORAL
  Filled 2019-01-31 (×2): qty 1

## 2019-01-31 MED ORDER — FENTANYL CITRATE (PF) 100 MCG/2ML IJ SOLN: 50.0000 ug | INTRAMUSCULAR | Status: DC | PRN

## 2019-01-31 MED ORDER — CHLORHEXIDINE GLUCONATE CLOTH 2 % EX PADS: 6.0000 | MEDICATED_PAD | Freq: Once | CUTANEOUS | Status: DC

## 2019-01-31 MED ORDER — PHENYLEPHRINE 40 MCG/ML (10ML) SYRINGE FOR IV PUSH (FOR BLOOD PRESSURE SUPPORT)
PREFILLED_SYRINGE | INTRAVENOUS | Status: AC
  Filled 2019-01-31: qty 10

## 2019-01-31 MED ORDER — VITAMIN D3 25 MCG (1000 UNIT) PO TABS
5000.0000 [IU] | ORAL_TABLET | Freq: Every day | ORAL | Status: DC
  Filled 2019-01-31 (×2): qty 5

## 2019-01-31 MED ORDER — IRBESARTAN 300 MG PO TABS
300.0000 mg | ORAL_TABLET | Freq: Every day | ORAL | Status: DC
  Filled 2019-01-31: qty 1

## 2019-01-31 MED ORDER — MIDAZOLAM HCL 2 MG/2ML IJ SOLN
2.0000 mg | Freq: Once | INTRAMUSCULAR | Status: AC
  Administered 2019-01-31: 2 mg via INTRAVENOUS
  Filled 2019-01-31: qty 2

## 2019-01-31 MED ORDER — ROPIVACAINE HCL 5 MG/ML IJ SOLN
INTRAMUSCULAR | Status: DC | PRN
  Administered 2019-01-31: 30 mL

## 2019-01-31 MED ORDER — TIZANIDINE HCL 2 MG PO TABS: 2.0000 mg | ORAL_TABLET | Freq: Every evening | ORAL | Status: DC | PRN

## 2019-01-31 MED ORDER — LORAZEPAM 0.5 MG PO TABS: 0.5000 mg | ORAL_TABLET | Freq: Three times a day (TID) | ORAL | Status: DC | PRN

## 2019-01-31 MED ORDER — EPHEDRINE 5 MG/ML INJ
INTRAVENOUS | Status: AC
  Filled 2019-01-31: qty 10

## 2019-01-31 MED ORDER — HYDROMORPHONE HCL 1 MG/ML IJ SOLN
0.2500 mg | INTRAMUSCULAR | Status: DC | PRN
  Administered 2019-01-31: 0.5 mg via INTRAVENOUS

## 2019-01-31 MED ORDER — LIDOCAINE HCL (CARDIAC) PF 100 MG/5ML IV SOSY
PREFILLED_SYRINGE | INTRAVENOUS | Status: DC | PRN
  Administered 2019-01-31: 60 mg via INTRAVENOUS

## 2019-01-31 MED ORDER — ROCURONIUM BROMIDE 50 MG/5ML IV SOSY
PREFILLED_SYRINGE | INTRAVENOUS | Status: AC
  Filled 2019-01-31: qty 5

## 2019-01-31 MED ORDER — ESCITALOPRAM OXALATE 10 MG PO TABS
10.0000 mg | ORAL_TABLET | Freq: Every day | ORAL | Status: DC
  Filled 2019-01-31: qty 1

## 2019-01-31 MED ORDER — LIDOCAINE 2% (20 MG/ML) 5 ML SYRINGE
INTRAMUSCULAR | Status: AC
  Filled 2019-01-31: qty 5

## 2019-01-31 MED ORDER — SODIUM CHLORIDE (PF) 0.9 % IJ SOLN
INTRAMUSCULAR | Status: AC
  Filled 2019-01-31: qty 10

## 2019-01-31 MED ORDER — OXYCODONE-ACETAMINOPHEN 5-325 MG PO TABS: 1.0000 | ORAL_TABLET | ORAL | Status: DC | PRN

## 2019-01-31 MED ORDER — ONDANSETRON HCL 4 MG/2ML IJ SOLN
INTRAMUSCULAR | Status: AC
  Filled 2019-01-31: qty 2

## 2019-01-31 MED ORDER — ACETAMINOPHEN 500 MG PO TABS
1000.0000 mg | ORAL_TABLET | ORAL | Status: AC
  Administered 2019-01-31: 1000 mg via ORAL
  Filled 2019-01-31: qty 2

## 2019-01-31 MED ORDER — SIMETHICONE 80 MG PO CHEW: 40.0000 mg | CHEWABLE_TABLET | Freq: Four times a day (QID) | ORAL | Status: DC | PRN

## 2019-01-31 MED ORDER — FLUTICASONE PROPIONATE 50 MCG/ACT NA SUSP
2.0000 | Freq: Every day | NASAL | Status: DC
  Filled 2019-01-31: qty 16

## 2019-01-31 MED ORDER — PROMETHAZINE HCL 25 MG/ML IJ SOLN: 6.2500 mg | INTRAMUSCULAR | Status: DC | PRN

## 2019-01-31 MED ORDER — FENTANYL CITRATE (PF) 250 MCG/5ML IJ SOLN
INTRAMUSCULAR | Status: DC | PRN
  Administered 2019-01-31 (×2): 50 ug via INTRAVENOUS

## 2019-01-31 MED ORDER — 0.9 % SODIUM CHLORIDE (POUR BTL) OPTIME
TOPICAL | Status: DC | PRN
  Administered 2019-01-31: 1000 mL

## 2019-01-31 MED ORDER — INSULIN ASPART 100 UNIT/ML ~~LOC~~ SOLN
0.0000 [IU] | Freq: Three times a day (TID) | SUBCUTANEOUS | Status: DC
  Administered 2019-01-31: 8 [IU] via SUBCUTANEOUS

## 2019-01-31 MED ORDER — LEVOTHYROXINE SODIUM 100 MCG PO TABS
100.0000 ug | ORAL_TABLET | Freq: Every day | ORAL | Status: DC
  Administered 2019-02-01: 100 ug via ORAL
  Filled 2019-01-31: qty 1

## 2019-01-31 MED ORDER — ONDANSETRON HCL 4 MG/2ML IJ SOLN
INTRAMUSCULAR | Status: DC | PRN
  Administered 2019-01-31: 4 mg via INTRAVENOUS

## 2019-01-31 MED ORDER — LACTATED RINGERS IV SOLN
INTRAVENOUS | Status: DC
  Administered 2019-01-31 (×2): via INTRAVENOUS

## 2019-01-31 MED ORDER — PROPOFOL 10 MG/ML IV BOLUS
INTRAVENOUS | Status: DC | PRN
  Administered 2019-01-31: 160 mg via INTRAVENOUS

## 2019-01-31 MED ORDER — ZOLPIDEM TARTRATE 5 MG PO TABS: 5.0000 mg | ORAL_TABLET | Freq: Every evening | ORAL | Status: DC | PRN

## 2019-01-31 MED ORDER — KCL IN DEXTROSE-NACL 20-5-0.9 MEQ/L-%-% IV SOLN
INTRAVENOUS | Status: DC
  Administered 2019-01-31: 17:00:00 via INTRAVENOUS
  Filled 2019-01-31: qty 1000

## 2019-01-31 MED ORDER — METHOCARBAMOL 500 MG PO TABS
500.0000 mg | ORAL_TABLET | Freq: Four times a day (QID) | ORAL | Status: DC | PRN
  Administered 2019-02-01: 500 mg via ORAL
  Filled 2019-01-31: qty 1

## 2019-01-31 MED ORDER — PROPOFOL 10 MG/ML IV BOLUS
INTRAVENOUS | Status: AC
  Filled 2019-01-31: qty 20

## 2019-01-31 MED ORDER — PHENYLEPHRINE HCL 10 MG/ML IJ SOLN
INTRAMUSCULAR | Status: DC | PRN
  Administered 2019-01-31: 120 ug via INTRAVENOUS
  Administered 2019-01-31 (×2): 80 ug via INTRAVENOUS

## 2019-01-31 MED ORDER — KETOROLAC TROMETHAMINE 30 MG/ML IJ SOLN
INTRAMUSCULAR | Status: AC
  Filled 2019-01-31: qty 1

## 2019-01-31 MED ORDER — DEXAMETHASONE SODIUM PHOSPHATE 10 MG/ML IJ SOLN
INTRAMUSCULAR | Status: AC
  Filled 2019-01-31: qty 2

## 2019-01-31 MED ORDER — KETOROLAC TROMETHAMINE 30 MG/ML IJ SOLN
15.0000 mg | Freq: Once | INTRAMUSCULAR | Status: AC | PRN
  Administered 2019-01-31: 15 mg via INTRAVENOUS

## 2019-01-31 MED ORDER — EPHEDRINE SULFATE 50 MG/ML IJ SOLN
INTRAMUSCULAR | Status: DC | PRN
  Administered 2019-01-31 (×2): 5 mg via INTRAVENOUS

## 2019-01-31 MED ORDER — METHYLENE BLUE 0.5 % INJ SOLN
INTRAVENOUS | Status: AC
  Filled 2019-01-31: qty 10

## 2019-01-31 MED ORDER — MONTELUKAST SODIUM 10 MG PO TABS
10.0000 mg | ORAL_TABLET | Freq: Every day | ORAL | Status: DC
  Administered 2019-01-31: 10 mg via ORAL
  Filled 2019-01-31: qty 1

## 2019-01-31 MED ORDER — MOMETASONE FURO-FORMOTEROL FUM 200-5 MCG/ACT IN AERO
2.0000 | INHALATION_SPRAY | Freq: Two times a day (BID) | RESPIRATORY_TRACT | Status: DC
  Filled 2019-01-31: qty 8.8

## 2019-01-31 MED ORDER — HYDRALAZINE HCL 20 MG/ML IJ SOLN: 10.0000 mg | INTRAMUSCULAR | Status: DC | PRN

## 2019-01-31 MED ORDER — FENTANYL CITRATE (PF) 250 MCG/5ML IJ SOLN
INTRAMUSCULAR | Status: AC
  Filled 2019-01-31: qty 5

## 2019-01-31 MED ORDER — ENOXAPARIN SODIUM 40 MG/0.4ML ~~LOC~~ SOLN: 40.0000 mg | SUBCUTANEOUS | Status: DC

## 2019-01-31 MED ORDER — GABAPENTIN 300 MG PO CAPS
300.0000 mg | ORAL_CAPSULE | ORAL | Status: AC
  Administered 2019-01-31: 300 mg via ORAL
  Filled 2019-01-31: qty 1

## 2019-01-31 MED ORDER — TECHNETIUM TC 99M SULFUR COLLOID FILTERED
1.0000 | Freq: Once | INTRAVENOUS | Status: AC | PRN
  Administered 2019-01-31: 1 via INTRADERMAL

## 2019-01-31 MED ORDER — AMLODIPINE BESYLATE 5 MG PO TABS
5.0000 mg | ORAL_TABLET | Freq: Every day | ORAL | Status: DC
  Filled 2019-01-31: qty 1

## 2019-01-31 SURGICAL SUPPLY — 48 items
APPLIER CLIP 9.375 MED OPEN (MISCELLANEOUS) ×3
BINDER BREAST LRG (GAUZE/BANDAGES/DRESSINGS) IMPLANT
BINDER BREAST XLRG (GAUZE/BANDAGES/DRESSINGS) ×6 IMPLANT
BIOPATCH RED 1 DISK 7.0 (GAUZE/BANDAGES/DRESSINGS) ×4 IMPLANT
BIOPATCH RED 1IN DISK 7.0MM (GAUZE/BANDAGES/DRESSINGS) ×2
CANISTER SUCT 3000ML PPV (MISCELLANEOUS) IMPLANT
CHLORAPREP W/TINT 26ML (MISCELLANEOUS) ×3 IMPLANT
CLIP APPLIE 9.375 MED OPEN (MISCELLANEOUS) ×1 IMPLANT
CONT SPEC 4OZ CLIKSEAL STRL BL (MISCELLANEOUS) ×3 IMPLANT
COVER PROBE W GEL 5X96 (DRAPES) ×3 IMPLANT
COVER SURGICAL LIGHT HANDLE (MISCELLANEOUS) ×3 IMPLANT
COVER WAND RF STERILE (DRAPES) ×3 IMPLANT
DERMABOND ADVANCED (GAUZE/BANDAGES/DRESSINGS) ×4
DERMABOND ADVANCED .7 DNX12 (GAUZE/BANDAGES/DRESSINGS) ×2 IMPLANT
DRAIN CHANNEL 10F 3/8 F FF (DRAIN) ×6 IMPLANT
DRAIN CHANNEL 19F RND (DRAIN) ×3 IMPLANT
DRAPE CHEST BREAST 15X10 FENES (DRAPES) ×3 IMPLANT
DRSG TEGADERM 4X4.75 (GAUZE/BANDAGES/DRESSINGS) ×3 IMPLANT
ELECT CAUTERY BLADE 6.4 (BLADE) ×3 IMPLANT
ELECT REM PT RETURN 9FT ADLT (ELECTROSURGICAL) ×3
ELECTRODE REM PT RTRN 9FT ADLT (ELECTROSURGICAL) ×1 IMPLANT
EVACUATOR SILICONE 100CC (DRAIN) ×6 IMPLANT
GLOVE BIO SURGEON STRL SZ8 (GLOVE) ×3 IMPLANT
GLOVE BIOGEL PI IND STRL 8 (GLOVE) ×1 IMPLANT
GLOVE BIOGEL PI INDICATOR 8 (GLOVE) ×2
GOWN STRL REUS W/ TWL LRG LVL3 (GOWN DISPOSABLE) ×2 IMPLANT
GOWN STRL REUS W/ TWL XL LVL3 (GOWN DISPOSABLE) ×1 IMPLANT
GOWN STRL REUS W/TWL LRG LVL3 (GOWN DISPOSABLE) ×4
GOWN STRL REUS W/TWL XL LVL3 (GOWN DISPOSABLE) ×2
KIT BASIN OR (CUSTOM PROCEDURE TRAY) ×3 IMPLANT
KIT TURNOVER KIT B (KITS) ×3 IMPLANT
NEEDLE 18GX1X1/2 (RX/OR ONLY) (NEEDLE) IMPLANT
NEEDLE FILTER BLUNT 18X 1/2SAF (NEEDLE)
NEEDLE FILTER BLUNT 18X1 1/2 (NEEDLE) IMPLANT
NEEDLE HYPO 25GX1X1/2 BEV (NEEDLE) IMPLANT
NS IRRIG 1000ML POUR BTL (IV SOLUTION) ×3 IMPLANT
PACK GENERAL/GYN (CUSTOM PROCEDURE TRAY) ×3 IMPLANT
PAD ABD 8X10 STRL (GAUZE/BANDAGES/DRESSINGS) ×6 IMPLANT
PAD ARMBOARD 7.5X6 YLW CONV (MISCELLANEOUS) ×3 IMPLANT
PENCIL SMOKE EVACUATOR (MISCELLANEOUS) ×3 IMPLANT
PIN SAFETY STERILE (MISCELLANEOUS) ×3 IMPLANT
SPECIMEN JAR X LARGE (MISCELLANEOUS) ×3 IMPLANT
SUT ETHILON 3 0 FSL (SUTURE) ×3 IMPLANT
SUT MNCRL AB 4-0 PS2 18 (SUTURE) IMPLANT
SUT VIC AB 3-0 SH 18 (SUTURE) ×6 IMPLANT
SYR CONTROL 10ML LL (SYRINGE) IMPLANT
TOWEL OR 17X24 6PK STRL BLUE (TOWEL DISPOSABLE) ×3 IMPLANT
TOWEL OR 17X26 10 PK STRL BLUE (TOWEL DISPOSABLE) ×3 IMPLANT

## 2019-01-31 NOTE — Op Note (Signed)
Kristin Ball 1946/07/31 [id number] 01/31/2019   Preoperative diagnosis: Multifocal stage I left breast cancer  Postoperative diagnosis: Same  Procedure: Left simple mastectomy with left axillary deep sentinel lymph node mapping  Surgeon: Turner Daniels MD  Assistant surgeon: none  Anesthesia:General and Pectoral block  Clinical History and Indications:The patient is seen in the holding area and we reviewed the plans for the procedure as noted above. We reviewed the risks and complications a final time. She had no further questions. I marked theleft side as the operative side.The surgical and non surgical options have been discussed with the patient.  Risks of surgery include bleeding,  Infection,  Flap necrosis,  Tissue loss,  Chronic pain, death, Numbness,  And the need for additional procedures.  Reconstruction options also have been discussed with the patient as well.  The patient agrees to proceed.Sentinel lymph node mapping and dissection has been discussed with the patient.  Risk of bleeding,  Infection,  Seroma formation,  Additional procedures,,  Shoulder weakness ,  Shoulder stiffness,  Nerve and blood vessel injury and reaction to the mapping dyes have been discussed.  Alternatives to surgery have been discussed with the patient.  The patient agrees to proceed.  Description of Procedure: The patient was taken to the operating room. After satisfactory general anesthesia was obtained the timeout was done.    A full prep and drape was then done.  I outlined an elliptical incision and marked the inframammary fold and midline. The incision was made. The usual skin flaps were raised. I went to the clavicle superiorly and sternum medially and towards the latissimus laterally.    After the superior flap was complete I was able to use the neoprobe to locate a sentinel node.  A single hot level 1 deep left axillary sentinel nodes identified and removed.  Background counts approached 0.   Sent to pathology.     The inferior flap was then made going to the inframammary fold and out to the latissimus. The breast was then removed from the pectoralis starting medially and working laterally. When I got to the lateral aspect of the pectoralis major muscle I opened the clavipectoral fascia. I finished removing the breast from the serratus muscles.    I therefore completed the mastectomy by dividing the remaining attachments from the breast to the chest wall and latissimus but not taking any more tissue from the axilla. The specimen was marked to orient it for the pathologist and passed off the table.  I spent several minutes irrigating and making sure everything was dry. I then placed two  19 [place] Blake drain through a small incision in the inferior flap and laid along the pectoralis muscle. It was secured with a 2-0 nylon suture.  Another irrigation and check for hemostasis was made. Everything appeared to be dry. Incision was then closed with 3-0 Vicryl and 4-0 Monocryl.  Dermabond applied.  The patient tolerated the procedure well. There were no operative complications. All counts were correct. Estimated blood loss was 100 cc . Sterile dressings were applied and the patient taken to the PACU in satisfactory condition.  Turner Daniels, MD, FACS 01/31/2019 10:41 AM

## 2019-01-31 NOTE — Anesthesia Procedure Notes (Signed)
Anesthesia Procedure Image    

## 2019-01-31 NOTE — Interval H&P Note (Signed)
History and Physical Interval Note:  01/31/2019 8:25 AM  Kristin Ball  has presented today for surgery, with the diagnosis of LEFT BREAST CANCER  The various methods of treatment have been discussed with the patient and family. After consideration of risks, benefits and other options for treatment, the patient has consented to  Procedure(s): LEFT SIMPLE MASTECTOMY WITH LEFT AXILLARY [employer] (Left) as a surgical intervention .  The patient's history has been reviewed, patient examined, no change in status, stable for surgery.  I have reviewed the patient's chart and labs.  Questions were answered to the patient's satisfaction.     [place]

## 2019-01-31 NOTE — Transfer of Care (Signed)
Immediate Anesthesia Transfer of Care Note  Patient: Kristin Ball  Procedure(s) Performed: LEFT SIMPLE MASTECTOMY WITH LEFT AXILLARY SENTINEL NODE MAPPING (Left Breast)  Patient Location: PACU  Anesthesia Type:General and Regional  Level of Consciousness: awake, alert , oriented and patient cooperative  Airway & Oxygen Therapy: Patient Spontanous Breathing and Patient connected to nasal cannula oxygen  Post-op Assessment: Report given to RN and Post -op Vital signs reviewed and stable  Post vital signs: Reviewed and stable  Last Vitals:  Vitals Value Taken Time  BP 141/76 01/31/2019 11:21 AM  Temp    Pulse 81 01/31/2019 11:22 AM  Resp 12 01/31/2019 11:22 AM  SpO2 96 % 01/31/2019 11:22 AM  Vitals shown include unvalidated device data.  Last Pain:  Vitals:   01/31/19 0721  TempSrc: Oral      Patients Stated Pain Goal: 0 (24/23/53 6144)  Complications: No apparent anesthesia complications

## 2019-01-31 NOTE — Anesthesia Preprocedure Evaluation (Signed)
Anesthesia Evaluation  Patient identified by MRN, date of birth, ID band Patient awake    Reviewed: Allergy & Precautions, NPO status , Patient's Chart, lab work & pertinent test results  History of Anesthesia Complications (+) PONV  Airway Mallampati: II  TM Distance: >3 FB Neck ROM: Full    Dental no notable dental hx.    Pulmonary asthma ,    Pulmonary exam normal breath sounds clear to auscultation       Cardiovascular hypertension, Normal cardiovascular exam Rhythm:Regular Rate:Normal     Neuro/Psych negative neurological ROS  negative psych ROS   GI/Hepatic Neg liver ROS, GERD  ,  Endo/Other  Hypothyroidism   Renal/GU negative Renal ROS  negative genitourinary   Musculoskeletal negative musculoskeletal ROS (+)   Abdominal   Peds negative pediatric ROS (+)  Hematology negative hematology ROS (+)   Anesthesia Other Findings   Reproductive/Obstetrics negative OB ROS                             Anesthesia Physical Anesthesia Plan  ASA: II  Anesthesia Plan: General   Post-op Pain Management:  Regional for Post-op pain   Induction: Intravenous  PONV Risk Score and Plan: 4 or greater and Ondansetron, Dexamethasone, Treatment may vary due to age or medical condition and Scopolamine patch - Pre-op  Airway Management Planned: LMA  Additional Equipment:   Intra-op Plan:   Post-operative Plan: Extubation in OR  Informed Consent: I have reviewed the patients History and Physical, chart, labs and discussed the procedure including the risks, benefits and alternatives for the proposed anesthesia with the patient or authorized representative who has indicated his/her understanding and acceptance.     Dental advisory given  Plan Discussed with: CRNA  Anesthesia Plan Comments:         Anesthesia Quick Evaluation

## 2019-01-31 NOTE — Anesthesia Procedure Notes (Signed)
Anesthesia Regional Block: Pectoralis block   Pre-Anesthetic Checklist: ,, timeout performed, Correct Patient, Correct Site, Correct Laterality, Correct Procedure, Correct Position, site marked, Risks and benefits discussed,  Surgical consent,  Pre-op evaluation,  At surgeon's request and post-op pain management  Laterality: Left  Prep: chloraprep       Needles:  Injection technique: Single-shot  Needle Type: Echogenic Needle     Needle Length: 9cm      Additional Needles:   Procedures:,,,, ultrasound used (permanent image in chart),,,,  Narrative:  Start time: 01/31/2019 8:32 AM End time: 01/31/2019 8:43 AM Injection made incrementally with aspirations every 5 mL.  Performed by: Personally  Anesthesiologist: Myrtie Soman, MD  Additional Notes: Patient tolerated the procedure well without complications

## 2019-01-31 NOTE — Anesthesia Postprocedure Evaluation (Signed)
Anesthesia Post Note  Patient: Kristin Ball  Procedure(s) Performed: LEFT SIMPLE MASTECTOMY WITH LEFT AXILLARY SENTINEL NODE MAPPING (Left Breast)     Patient location during evaluation: PACU Anesthesia Type: General Level of consciousness: awake and alert Pain management: pain level controlled Vital Signs Assessment: post-procedure vital signs reviewed and stable Respiratory status: spontaneous breathing, nonlabored ventilation, respiratory function stable and patient connected to nasal cannula oxygen Cardiovascular status: blood pressure returned to baseline and stable Postop Assessment: no apparent nausea or vomiting Anesthetic complications: no    Last Vitals:  Vitals:   01/31/19 1251 01/31/19 1306  BP: 137/66 134/62  Pulse: 78 84  Resp: 13 16  Temp: 36.6 C   SpO2: (!) 89% 92%    Last Pain:  Vitals:   01/31/19 1251  TempSrc:   PainSc: 0-No pain                 Peyton Rossner S

## 2019-01-31 NOTE — Plan of Care (Signed)
  Problem: Pain Managment: Goal: General experience of comfort will improve Outcome: Progressing   Problem: Safety: Goal: Ability to remain free from injury will improve Outcome: Progressing   Problem: Skin Integrity: Goal: Risk for impaired skin integrity will decrease Outcome: Progressing   

## 2019-01-31 NOTE — Anesthesia Procedure Notes (Signed)
Procedure Name: LMA Insertion Date/Time: 01/31/2019 9:10 AM Performed by: Shirlyn Goltz, CRNA Pre-anesthesia Checklist: Patient identified, Emergency Drugs available, Patient being monitored and Suction available Patient Re-evaluated:Patient Re-evaluated prior to induction Oxygen Delivery Method: Circle system utilized Preoxygenation: Pre-oxygenation with 100% oxygen Induction Type: IV induction Ventilation: Mask ventilation without difficulty LMA: LMA inserted LMA Size: 4.0 Number of attempts: 1 Placement Confirmation: positive ETCO2 and breath sounds checked- equal and bilateral Tube secured with: Tape Dental Injury: Teeth and Oropharynx as per pre-operative assessment

## 2019-02-01 ENCOUNTER — Telehealth: Payer: Self-pay | Admitting: *Deleted

## 2019-02-01 ENCOUNTER — Encounter (HOSPITAL_COMMUNITY): Payer: Self-pay | Admitting: Surgery

## 2019-02-01 DIAGNOSIS — C50112 Malignant neoplasm of central portion of left female breast: Secondary | ICD-10-CM | POA: Diagnosis not present

## 2019-02-01 LAB — BASIC METABOLIC PANEL
Anion gap: 10 (ref 5–15)
BUN: 18 mg/dL (ref 8–23)
CO2: 20 mmol/L — ABNORMAL LOW (ref 22–32)
Calcium: 8.5 mg/dL — ABNORMAL LOW (ref 8.9–10.3)
Chloride: 109 mmol/L (ref 98–111)
Creatinine, Ser: 0.93 mg/dL (ref 0.44–1.00)
GFR calc Af Amer: >60 mL/min (ref >60)
GFR calc non Af Amer: >60 mL/min (ref >60)
GLUCOSE: 178 mg/dL — AB (ref 70–99)
Potassium: 4.5 mmol/L (ref 3.5–5.1)
Sodium: 139 mmol/L (ref 135–145)

## 2019-02-01 LAB — CBC
HCT: 37.8 % (ref 36.0–46.0)
Hemoglobin: 11.7 g/dL — ABNORMAL LOW (ref 12.0–15.0)
MCH: 29.8 pg (ref 26.0–34.0)
MCHC: 31 g/dL (ref 30.0–36.0)
MCV: 96.4 fL (ref 80.0–100.0)
Platelets: 237 10*3/uL (ref 150–400)
RBC: 3.92 MIL/uL (ref 3.87–5.11)
RDW: 13.2 % (ref 11.5–15.5)
WBC: 16.6 10*3/uL — ABNORMAL HIGH (ref 4.0–10.5)
nRBC: 0 % (ref 0.0–0.2)

## 2019-02-01 LAB — GLUCOSE, CAPILLARY: Glucose-Capillary: 133 mg/dL — ABNORMAL HIGH (ref 70–99)

## 2019-02-01 MED ORDER — OXYCODONE-ACETAMINOPHEN 5-325 MG PO TABS: 1.0000 | ORAL_TABLET | ORAL | 0 refills | Status: AC | PRN

## 2019-02-01 NOTE — Progress Notes (Signed)
[facility] to be D/C'd  per MD order. Discussed with the patient and all questions fully answered.  VSS, Skin clean, dry and intact without evidence of skin break down, no evidence of skin tears noted.  IV catheter discontinued intact. Site without signs and symptoms of complications. Dressing and pressure applied.  An After Visit Summary was printed and given to the patient. Patient received prescription.  D/c education completed with patient/family including follow up instructions, medication list, d/c activities limitations if indicated, with other d/c instructions as indicated by MD - patient able to verbalize understanding, all questions fully answered.   Patient instructed to return to ED, call 911, or call MD for any changes in condition.   Patient to be escorted via [place], and D/C home via private auto.

## 2019-02-01 NOTE — Discharge Instructions (Signed)
[facility] [place] °[phone] ° °MASTECTOMY: POST OP INSTRUCTIONS ° °Always review your discharge instruction sheet given to you by the facility where your surgery was performed. °IF YOU HAVE DISABILITY OR FAMILY LEAVE FORMS, YOU MUST BRING THEM TO THE OFFICE FOR PROCESSING.   °DO NOT GIVE THEM TO YOUR DOCTOR. °A prescription for pain medication may be given to you upon discharge.  Take your pain medication as prescribed, if needed.  If narcotic pain medicine is not needed, then you may take acetaminophen (Tylenol) or ibuprofen (Advil) as needed. °1. Take your usually prescribed medications unless otherwise directed. °2. If you need a refill on your pain medication, please contact your pharmacy.  They will contact our office to request authorization.  Prescriptions will not be filled after 5pm or on week-ends. °3. You should follow a light diet the first few days after arrival home, such as soup and crackers, etc.  Resume your normal diet the day after surgery. °4. Most patients will experience some swelling and bruising on the chest and underarm.  Ice packs will help.  Swelling and bruising can take several days to resolve.  °5. It is common to experience some constipation if taking pain medication after surgery.  Increasing fluid intake and taking a stool softener (such as Colace) will usually help or prevent this problem from occurring.  A mild laxative (Milk of Magnesia or Miralax) should be taken according to package instructions if there are no bowel movements after 48 hours. °6. Unless discharge instructions indicate otherwise, leave your bandage dry and in place until your next appointment in 3-5 days.  You may take a limited sponge bath.  No tube baths or showers until the drains are removed.  You may have steri-strips (small skin tapes) in place directly over the incision.  These strips should be left on the skin for 7-10 days.  If your surgeon used skin glue on the incision, you may  shower in 24 hours.  The glue will flake off over the next 2-3 weeks.  Any sutures or staples will be removed at the office during your follow-up visit. °7. DRAINS:  If you have drains in place, it is important to keep a list of the amount of drainage produced each day in your drains.  Before leaving the hospital, you should be instructed on drain care.  Call our office if you have any questions about your drains. °8. ACTIVITIES:  You may resume regular (light) daily activities beginning the next day--such as daily self-care, walking, climbing stairs--gradually increasing activities as tolerated.  You may have sexual intercourse when it is comfortable.  Refrain from any heavy lifting or straining until approved by your doctor. °a. You may drive when you are no longer taking prescription pain medication, you can comfortably wear a seatbelt, and you can safely maneuver your car and apply brakes. °b. RETURN TO WORK:  __________________________________________________________ °9. You should see your doctor in the office for a follow-up appointment approximately 3-5 days after your surgery.  Your doctor’s nurse will typically make your follow-up appointment when she calls you with your pathology report.  Expect your pathology report 2-3 business days after your surgery.  You may call to check if you do not hear from us after three days.   °10. OTHER INSTRUCTIONS: ______________________________________________________________________________________________ ____________________________________________________________________________________________ °WHEN TO CALL YOUR DOCTOR: °1. Fever over 101.0 °2. Nausea and/or vomiting °3. Extreme swelling or bruising °4. Continued bleeding from incision. °5. Increased pain, redness, or drainage from the incision. °  The clinic staff is available to answer your questions during regular business hours.  Please don’t hesitate to call and ask to speak to one of the nurses for clinical  concerns.  If you have a medical emergency, go to the nearest emergency room or call 911.  A surgeon from [facility] is always on call at the hospital. °[address], [address], [place], [place]  [address] ? [address] [address], [place], [place]   [address] °[phone] ? [phone] ? FAX [phone] °Web site: www.cent °Surgical Drain [facility] °Surgical drains are used to remove extra fluid that normally builds up in a surgical wound after surgery. A surgical drain helps to heal a surgical wound. Different kinds of surgical drains include: °· Active drains. These drains use suction to pull drainage away from the surgical wound. Drainage flows through a tube to a container outside of the body. It is important to keep the bulb or the drainage container flat (compressed) at all times, except while you empty it. Flattening the bulb or container creates suction. The two most common types of active drains are bulb drains and Hemovac drains. °· Passive drains. These drains allow fluid to drain naturally, by gravity. Drainage flows through a tube to a bandage (dressing) or a container outside of the body. Passive drains do not need to be emptied. The most common type of passive drain is the Penrose drain. °A drain is placed during surgery. Immediately after surgery, drainage is usually bright red and a little thicker than water. The drainage may gradually turn yellow or pink and become thinner. It is likely that your health care provider will remove the drain when the drainage stops or when the amount decreases to 1-2 Tbsp (15-30 mL) during a 24-hour period. °How to care for your surgical drain °It is important to care for your drain to prevent infection. If your drain is placed at your back, or any other hard-to-reach area, ask another person to assist you in performing the following steps: °· Keep the skin around the drain dry and covered with a dressing at all times. °· Check your drain area every  day for signs of infection. Check for: °? More redness, swelling, or pain. °? Pus or a bad smell. °? Cloudy drainage. °Follow instructions from your health care provider about how to take care of your drain and how to change your dressing. Change your dressing at least one time every day. Change it more often if needed to keep the dressing dry. Make sure you: °1. Gather your supplies, including: °? Tape. °? Germ-free cleaning solution (sterile saline). °? Split gauze drain sponge: 4 x 4 inches (10 x 10 cm). °? Gauze square: 4 x 4 inches (10 x 10 cm). °2. Wash your hands with soap and water before you change your dressing. If soap and water are not available, use hand sanitizer. °3. Remove the old dressing. Avoid using scissors to do that. °4. Use sterile saline to clean your skin around the drain. °5. Place the tube through the slit in a drain sponge. Place the drain sponge so that it covers your wound. °6. Place the gauze square or another drain sponge on top of the drain sponge that is on the wound. Make sure the tube is between those layers. °7. Tape the dressing to your skin. °8. If you have an active bulb or Hemovac drain, tape the drainage tube to your skin 1-2 inches (2.5-5 cm) below the place where   the tube enters your body. Taping keeps the tube from pulling on any stitches (sutures) that you have. °9. Wash your hands with soap and water. °10. Write down the color of your drainage and how often you change your dressing. °How to empty your active bulb or Hemovac drain ° °1. Make sure that you have a measuring cup that you can empty your drainage into. °2. Wash your hands with soap and water. If soap and water are not available, use hand sanitizer. °3. Gently move your fingers down the tube while squeezing very lightly. This is called stripping the tube. This clears any drainage, clots, or tissue from the tube. °? Do not pull on the tube. °? You may need to strip the tube several times every day to keep the  tube clear. °4. Open the bulb cap or the drain plug. Do not touch the inside of the cap or the bottom of the plug. °5. Empty all of the drainage into the measuring cup. °6. Compress the bulb or the container and replace the cap or the plug. To compress the bulb or the container, squeeze it firmly in the middle while you close the cap or plug the container. °7. Write down the amount of drainage that you have in each 24-hour period. If you have less than 2 Tbsp (30 mL) of drainage during 24 hours, contact your health care provider. °8. Flush the drainage down the toilet. °9. Wash your hands with soap and water. °Contact a health care provider if: °· You have more redness, swelling, or pain around your drain area. °· The amount of drainage that you have is increasing instead of decreasing. °· You have pus or a bad smell coming from your drain area. °· You have a fever. °· You have drainage that is cloudy. °· There is a sudden stop or a sudden decrease in the amount of drainage that you have. °· Your tube falls out. °· Your active drain does not stay compressed after you empty it. °Summary °· Surgical drains are used to remove extra fluid that normally builds up in a surgical wound after surgery. °· Different kinds of surgical drains include active drains and passive drains. Active drains use suction to pull drainage away from the surgical wound, and passive drains allow fluid to drain naturally. °· It is important to care for your drain to prevent infection. If your drain is placed at your back, or any other hard-to-reach area, ask another person to assist you. °· Contact your health care provider if you have more redness, swelling, or pain around your drain area. °This information is not intended to replace advice given to you by your health care provider. Make sure you discuss any questions you have with your health care provider. °Document Released: 11/18/2000 Document Revised: 12/14/2017 Document Reviewed:  06/10/2015 °Elsevier Interactive Patient Education © 2019 Elsevier Inc. ° °

## 2019-02-01 NOTE — Discharge Summary (Signed)
Physician Discharge Summary  Patient ID: Kristin Ball MRN: [id number] DOB/AGE: Oct 09, 1946 73 y.o.  Admit date: 01/31/2019 Discharge date: 02/01/2019  Admission [place] breast cancer  Discharge Diagnoses:  Active Problems:   Breast cancer, stage 1, left ([place])   Discharged Condition: good  Hospital Course: Pt DID WELL. TOLERATING HER DIET AND HAD GOOD PAIN CONTROL.   Consults: None  Treatments: surgery: LEFT MASTECTOMY   Discharge Exam: Blood pressure (!) 101/51, pulse 78, temperature 98.4 F (36.9 C), temperature source Oral, resp. rate 20, height 4' 11.75" (1.518 m), weight 71.9 kg, SpO2 98 %. General appearance: alert and cooperative Resp: clear to auscultation bilaterally Cardio: regular rate and rhythm, S1, S2 normal, no murmur, click, rub or gallop Incision/Wound:CDI flaps viable no hematoma   Disposition: Discharge disposition: 01-Home or Self Care       Discharge Instructions    Diet - low sodium heart healthy   Complete by:  As directed    Increase activity slowly   Complete by:  As directed      Allergies as of 02/01/2019      Reactions   Statins Other (See Comments)   Joint pain Kidney failure  Confusion   Darvon Hives   Ivp Dye [iodinated Diagnostic Agents] Hives      Medication List    TAKE these medications   albuterol 108 (90 Base) MCG/ACT inhaler Commonly known as:  PROVENTIL HFA;VENTOLIN HFA Inhale 1-2 puffs into the lungs every 4 (four) hours as needed for wheezing or shortness of breath.   amLODipine 5 MG tablet Commonly known as:  NORVASC Take 5 mg by mouth daily.   escitalopram 10 MG tablet Commonly known as:  LEXAPRO Take 10 mg by mouth daily.   famotidine 20 MG tablet Commonly known as:  PEPCID One at bedtime What changed:    how much to take  how to take this  when to take this   fluticasone 50 MCG/ACT nasal spray Commonly known as:  FLONASE Place 2 sprays into both nostrils daily.   irbesartan 300 MG  tablet Commonly known as:  AVAPRO Take 300 mg by mouth daily.   LORazepam 0.5 MG tablet Commonly known as:  ATIVAN Take 0.5 mg by mouth 3 (three) times daily as needed for anxiety.   montelukast 10 MG tablet Commonly known as:  SINGULAIR Take 10 mg by mouth at bedtime.   oxyCODONE-acetaminophen 5-325 MG tablet Commonly known as:  PERCOCET/ROXICET Take 1-2 tablets by mouth every 4 (four) hours as needed for moderate pain.   SYMBICORT 160-4.5 MCG/ACT inhaler Generic drug:  budesonide-formoterol Inhale 2 puffs into the lungs 2 (two) times daily.   SYNTHROID 100 MCG tablet Generic drug:  levothyroxine Take 100 mcg by mouth daily before breakfast.   tiZANidine 2 MG tablet Commonly known as:  ZANAFLEX Take 2 mg by mouth at bedtime as needed for muscle spasms.   vitamin B-12 1000 MCG tablet Commonly known as:  CYANOCOBALAMIN Take 1,000 mcg by mouth daily.   Vitamin D 125 MCG (5000 UT) Caps Take 5,000 Units by mouth daily.        Signed: Joyice Faster Georgeann Brinkman 02/01/2019, 8:56 AM

## 2019-02-01 NOTE — Telephone Encounter (Signed)
  Oncology Nurse Navigator Documentation  Navigator Location: [facility] (02/01/19 1100)   )Navigator Encounter Type: Telephone;[facility] Follow-up (02/01/19 1100) Telephone: Outgoing Call;Clinic/[facility] Follow-up (02/01/19 1100)     Surgery Date: 01/31/19 (02/01/19 1100)           Treatment Initiated Date: 01/31/19 (02/01/19 1100)                                Time Spent with Patient: 15 (02/01/19 1100)

## 2019-02-05 ENCOUNTER — Telehealth: Payer: Self-pay | Admitting: *Deleted

## 2019-02-05 NOTE — Telephone Encounter (Signed)
Ordered oncotype per Dr. Magrinat.  Faxed requisition to pathology and confirmed receipt.  

## 2019-02-12 ENCOUNTER — Encounter: Payer: Self-pay | Admitting: Oncology

## 2019-02-13 ENCOUNTER — Telehealth: Payer: Self-pay | Admitting: *Deleted

## 2019-02-13 NOTE — Telephone Encounter (Signed)
Received oncotype score of 3/3%. Physician team notified. Called pt with results and informed she does not need chemo. Discussed will have Dr. Lisbeth Renshaw review path to see if xrt is warranted. msg sent to Dr. Lisbeth Renshaw and Bryson Ha to review.

## 2019-02-15 ENCOUNTER — Telehealth: Payer: Self-pay | Admitting: Radiation Oncology

## 2019-02-15 NOTE — Telephone Encounter (Signed)
New message:     Cld pt to schedule from referral received and per patient she would like to discuss this with Dr. Brantley Stage before scheduling a about with Dr. Vincent Gros to discuss radiation.

## 2019-02-18 ENCOUNTER — Telehealth: Payer: Self-pay | Admitting: Radiation Oncology

## 2019-02-18 NOTE — Telephone Encounter (Signed)
LM for pt to call me back to discuss the rationale to consider postmastectomy XRT given her pathologic staging.

## 2019-02-18 NOTE — Telephone Encounter (Signed)
The patient called me back and let me know she was not planning to proceed with radiation. I let her know that despite the features of her cancer that we knew of during her first visit, the high risk features of her large tumor, her LVSI and perieneural invasion make her qualify for postmastectomy radiation. We discussed how this could recur. She was also under the impressions she only had a 3% chance of recurrence and I think she is taking the information from her oncotype score and is misunderstanding what it's purpose is. I told her that based on her oncotype score it's felt that chemo would only give her a 3% benefit, and that this is not a reflection of recurrence risk. She did not want to talk any further, but requests a new medical oncologist. I let her know  I would pass this on to navigation. She is planning to meet with Dr. Brantley Stage this Friday. She has my direct number to call me back when or if she's willing to speak more about options of radiotherapy, and if she's willing to schedule an appointment with [place] to discuss and possibly plan for treatment.     Carola Rhine, PAC

## 2019-02-18 NOTE — Telephone Encounter (Signed)
ok I'll talk with her

## 2019-02-19 ENCOUNTER — Telehealth: Payer: Self-pay | Admitting: Hematology and Oncology

## 2019-02-19 NOTE — Telephone Encounter (Signed)
Scheduled appt per 3/16 sch message - left message for patient with appt date and time

## 2019-02-21 ENCOUNTER — Encounter (HOSPITAL_COMMUNITY): Payer: Self-pay | Admitting: Oncology

## 2019-02-26 ENCOUNTER — Other Ambulatory Visit: Payer: Self-pay

## 2019-02-26 ENCOUNTER — Inpatient Hospital Stay: Payer: Medicare Other | Attending: Oncology | Admitting: Hematology and Oncology

## 2019-02-26 NOTE — Assessment & Plan Note (Deleted)
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 I discussed the margins as well as lymph node surgeries. We also discussed the final staging along with previously performed ER/PR and HER-2/neu testing. Oncotype DX score: 3: Distant recurrence of 9 years 3%  Treatment plan: 1.  Adjuvant radiation therapy: Patient is not keen on doing radiation 2.  Followed by adjuvant antiestrogen therapy with anastrozole 1 mg daily for 7 years  Return to clinic in 3 months for survivorship care plan visit   

## 2019-02-27 ENCOUNTER — Telehealth: Payer: Self-pay | Admitting: *Deleted

## 2019-02-27 NOTE — Telephone Encounter (Signed)
Left message for a return phone call about patient's appointments.

## 2019-02-28 ENCOUNTER — Encounter: Payer: Medicare Other | Admitting: Physical Therapy

## 2019-03-01 ENCOUNTER — Telehealth: Payer: Self-pay | Admitting: *Deleted

## 2019-03-01 NOTE — Telephone Encounter (Signed)
Medical records faxed to [facility] [place]; [place] [phone]

## 2019-03-08 ENCOUNTER — Telehealth: Payer: Self-pay | Admitting: *Deleted

## 2019-03-08 NOTE — Telephone Encounter (Signed)
Received a voicemail from patient stating she will be treated at the [facility] in [facility].  I have cancelled her appointments here.

## 2019-03-08 NOTE — Telephone Encounter (Signed)
Left message on voicemail for a return phone call to see if patient is seeking care elsewhere to please let us know.  I see he records were sent to [facility].  This is the 2nd attempt to call this patient with no response.

## 2019-03-14 ENCOUNTER — Telehealth: Payer: Self-pay | Admitting: Physical Therapy

## 2019-03-14 NOTE — Telephone Encounter (Signed)
Spoke with patient regarding her scheduled appointment with PT on 03/28/2019. We talked about how we are limiting patients coming in to the PT clinic unless it is urgent due to Covid19. She reported feeling like she is doing well and has regained full shoulder ROM. She has transferred her care to [facility] and did not feel a need to pursue PT. She was encouraged to contact me if anything changed. Kristin Ball, [place] 03/14/19 2:55 PM

## 2019-03-28 ENCOUNTER — Encounter: Payer: Self-pay | Admitting: Physical Therapy

## 2019-05-02 ENCOUNTER — Ambulatory Visit: Payer: Medicare Other | Admitting: Adult Health

## 2019-08-30 ENCOUNTER — Encounter: Payer: Self-pay | Admitting: *Deleted

## 2019-11-12 ENCOUNTER — Encounter: Payer: Self-pay | Admitting: *Deleted

## 2019-12-03 ENCOUNTER — Encounter: Payer: Self-pay | Admitting: *Deleted

## 2020-01-26 ENCOUNTER — Ambulatory Visit: Payer: Medicare Other | Attending: Internal Medicine

## 2020-01-26 DIAGNOSIS — Z23 Encounter for immunization: Secondary | ICD-10-CM | POA: Insufficient documentation

## 2020-01-26 NOTE — Progress Notes (Signed)
   Covid-19 Vaccination Clinic  Name:  Kristin Ball    MRN: [id number] DOB: May 23, 1946  01/26/2020  [name] was observed post Covid-19 immunization for 30 minutes based on pre-vaccination screening without incidence. She was provided with Vaccine Information Sheet and instruction to access the V-Safe system.   [name] was instructed to call 911 with any severe reactions post vaccine: Marland Kitchen Difficulty breathing  . Swelling of your face and throat  . A fast heartbeat  . A bad rash all over your body  . Dizziness and weakness    Immunizations Administered    Name Date Dose VIS Date Route   Pfizer COVID-19 Vaccine 01/26/2020  1:05 PM 0.3 mL 11/15/2019 Intramuscular   Manufacturer: [place]   Lot: [id number]   [place]: [id number]

## 2020-02-19 ENCOUNTER — Ambulatory Visit: Payer: Medicare Other | Attending: Internal Medicine

## 2020-02-19 DIAGNOSIS — Z23 Encounter for immunization: Secondary | ICD-10-CM

## 2020-02-19 NOTE — Progress Notes (Signed)
   Covid-19 Vaccination Clinic  Name:  Kristin Ball    MRN: [id number] DOB: 11-03-1946  02/19/2020  Kristin Ball was observed post Covid-19 immunization for 15 minutes without incident. She was provided with Vaccine Information Sheet and instruction to access the V-Safe system.   Kristin Ball was instructed to call 911 with any severe reactions post vaccine: Marland Kitchen Difficulty breathing  . Swelling of face and throat  . A fast heartbeat  . A bad rash all over body  . Dizziness and weakness   Immunizations Administered    Name Date Dose VIS Date Route   Pfizer COVID-19 Vaccine 02/19/2020 12:46 PM 0.3 mL 11/15/2019 Intramuscular   Manufacturer: [place]   Lot: [id number]   [place]: [id number]

## 2020-04-15 ENCOUNTER — Other Ambulatory Visit: Payer: Self-pay | Admitting: Oncology

## 2020-04-15 DIAGNOSIS — Z1231 Encounter for screening mammogram for malignant neoplasm of breast: Secondary | ICD-10-CM

## 2020-04-15 DIAGNOSIS — Z853 Personal history of malignant neoplasm of breast: Secondary | ICD-10-CM

## 2020-04-28 ENCOUNTER — Ambulatory Visit
Admission: RE | Admit: 2020-04-28 | Discharge: 2020-04-28 | Disposition: A | Discharge status: Home / Self Care | Payer: Medicare Other | Source: Ambulatory Visit | Attending: Oncology | Admitting: Oncology

## 2020-04-28 ENCOUNTER — Other Ambulatory Visit: Payer: Self-pay

## 2020-04-28 DIAGNOSIS — Z853 Personal history of malignant neoplasm of breast: Secondary | ICD-10-CM

## 2020-04-28 DIAGNOSIS — Z1231 Encounter for screening mammogram for malignant neoplasm of breast: Secondary | ICD-10-CM

## 2020-12-11 LAB — EXTERNAL GENERIC LAB PROCEDURE: COLOGUARD: NEGATIVE

## 2020-12-11 LAB — COLOGUARD: COLOGUARD: NEGATIVE

## 2021-04-09 ENCOUNTER — Other Ambulatory Visit: Payer: Self-pay | Admitting: Oncology

## 2021-04-09 DIAGNOSIS — Z1231 Encounter for screening mammogram for malignant neoplasm of breast: Secondary | ICD-10-CM

## 2021-06-03 ENCOUNTER — Ambulatory Visit
Admission: RE | Admit: 2021-06-03 | Discharge: 2021-06-03 | Disposition: A | Discharge status: Home / Self Care | Payer: Medicare Other | Source: Ambulatory Visit | Attending: Oncology | Admitting: Oncology

## 2021-06-03 ENCOUNTER — Other Ambulatory Visit: Payer: Self-pay

## 2021-06-03 DIAGNOSIS — Z1231 Encounter for screening mammogram for malignant neoplasm of breast: Secondary | ICD-10-CM

## 2021-10-03 IMAGING — MG MM DIGITAL SCREENING UNILAT*R* W/ TOMO W/ CAD
4 series · 4 of 12 positions shown · non-contrast
Comparison: Previous exam(s).

CLINICAL DATA: Screening.

EXAM:
DIGITAL SCREENING UNILATERAL RIGHT MAMMOGRAM WITH CAD AND
TOMOSYNTHESIS
TECHNIQUE: Right screening digital craniocaudal and mediolateral oblique
mammograms were obtained. Right screening digital breast
tomosynthesis was performed. The images were evaluated with
computer-aided detection.

[R CC synth-2D]
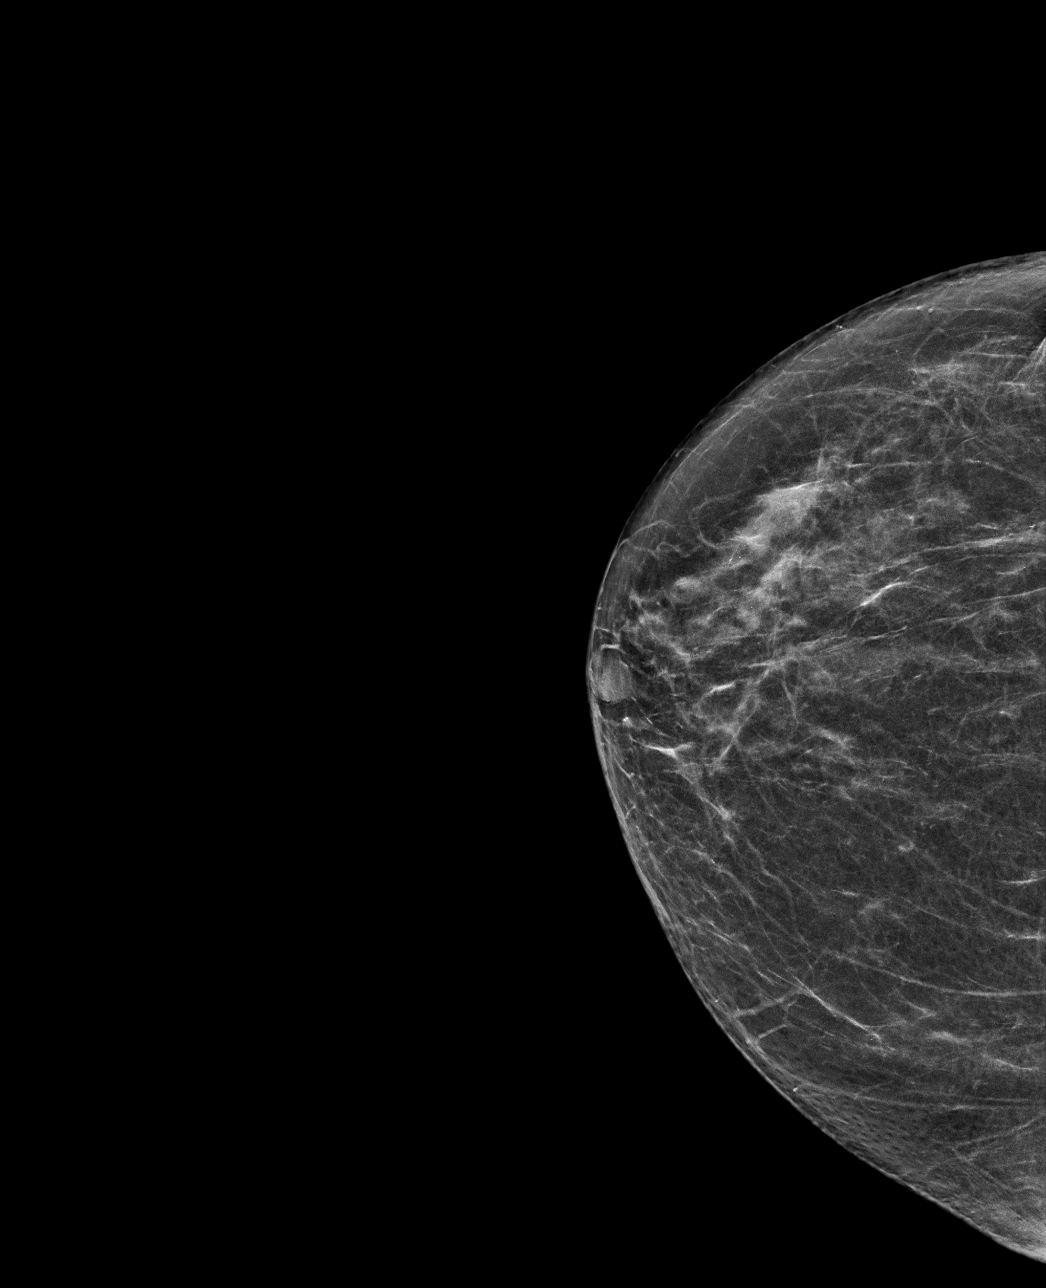

[R MLO synth-2D]
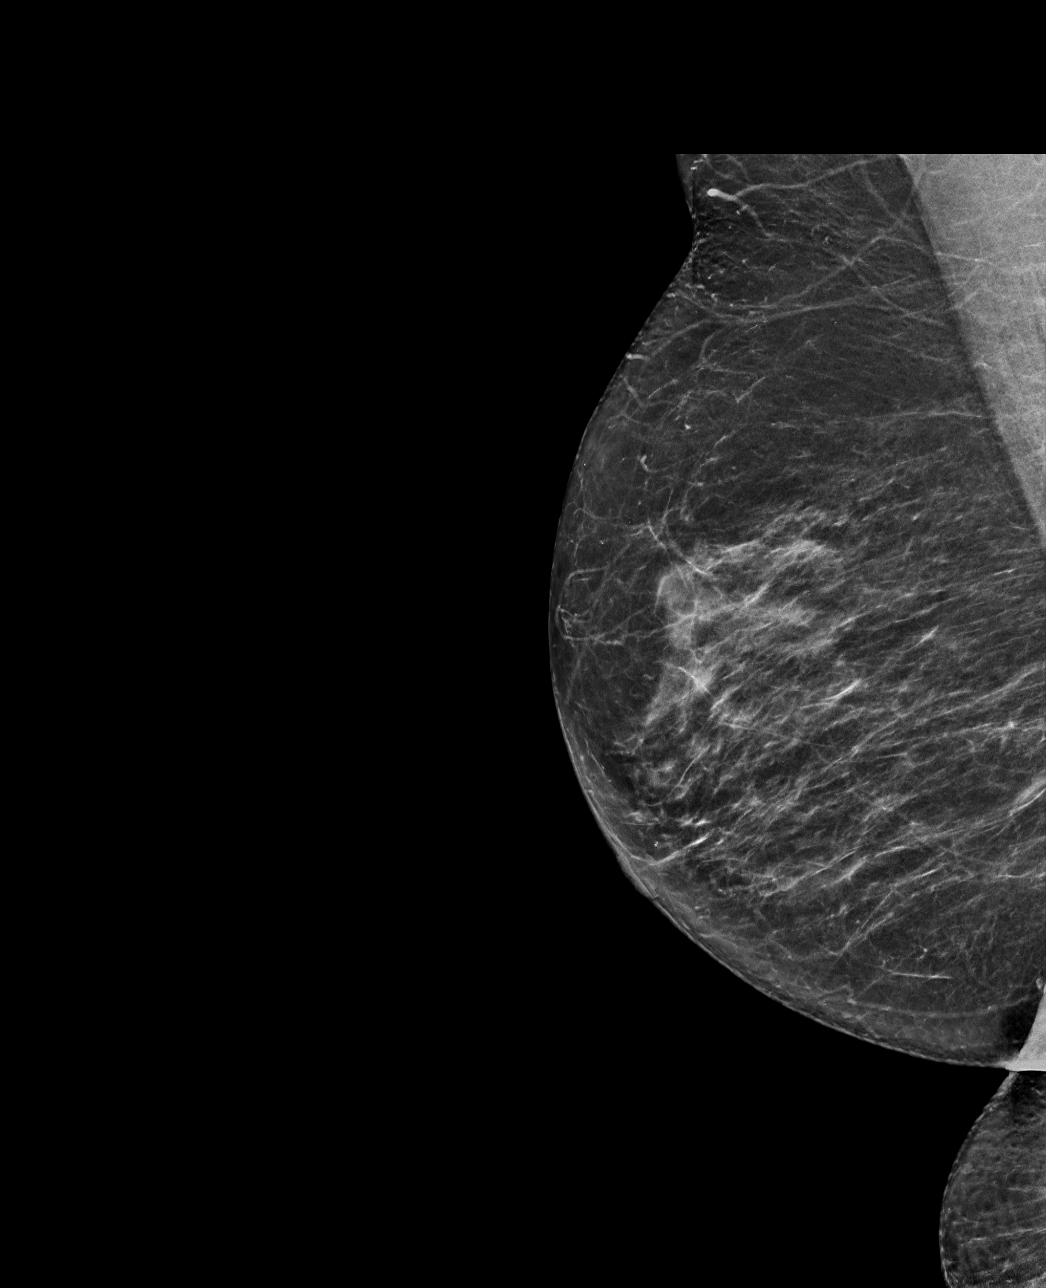

[R MLO tomo · tomo slice 33/66.0]
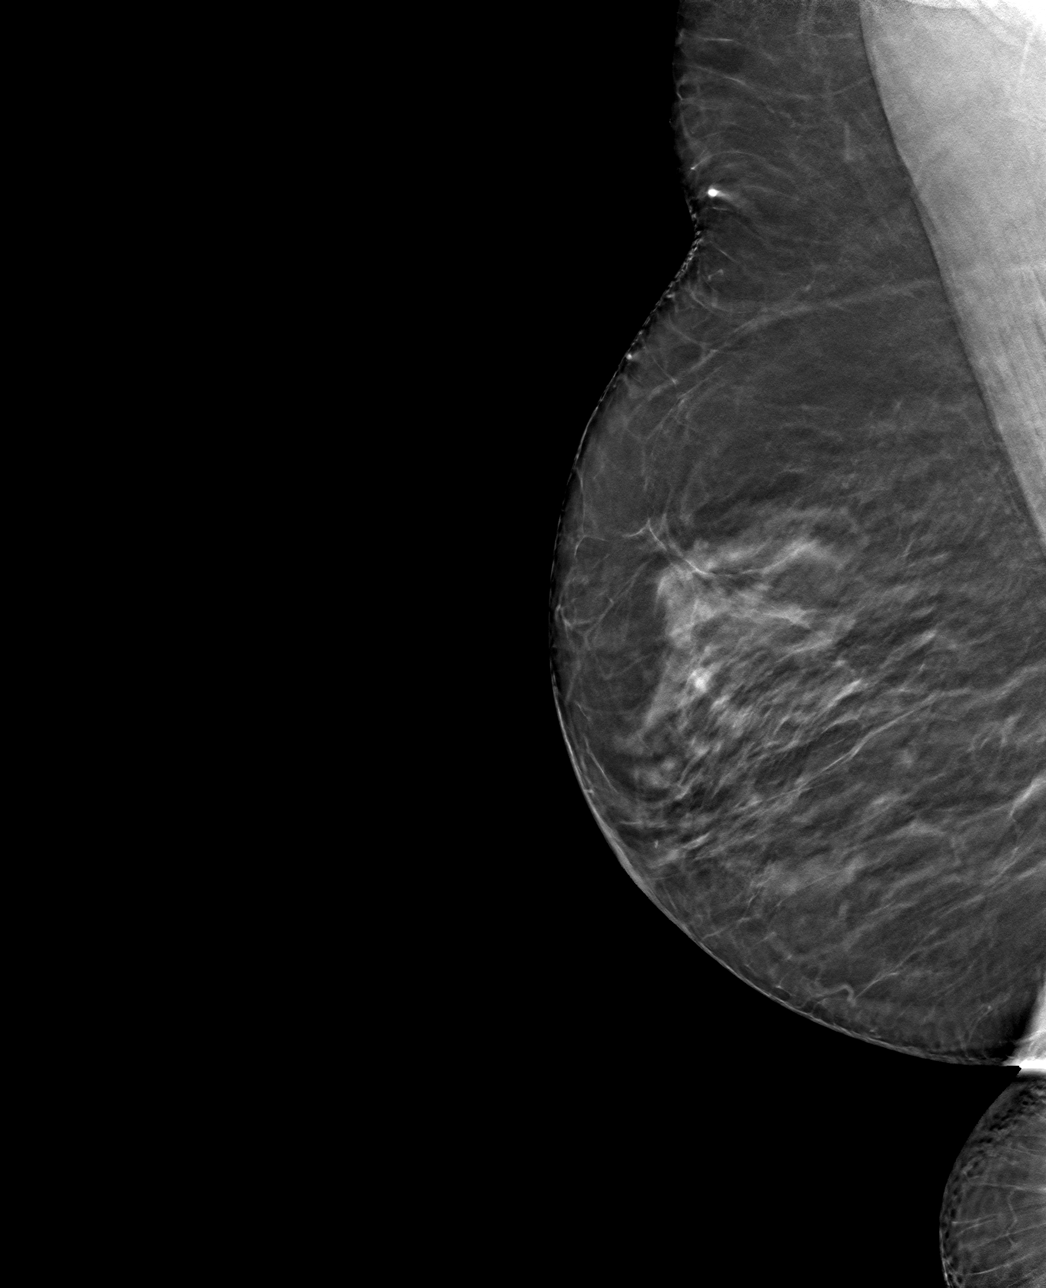

[R CC tomo · tomo slice 33/64.0]
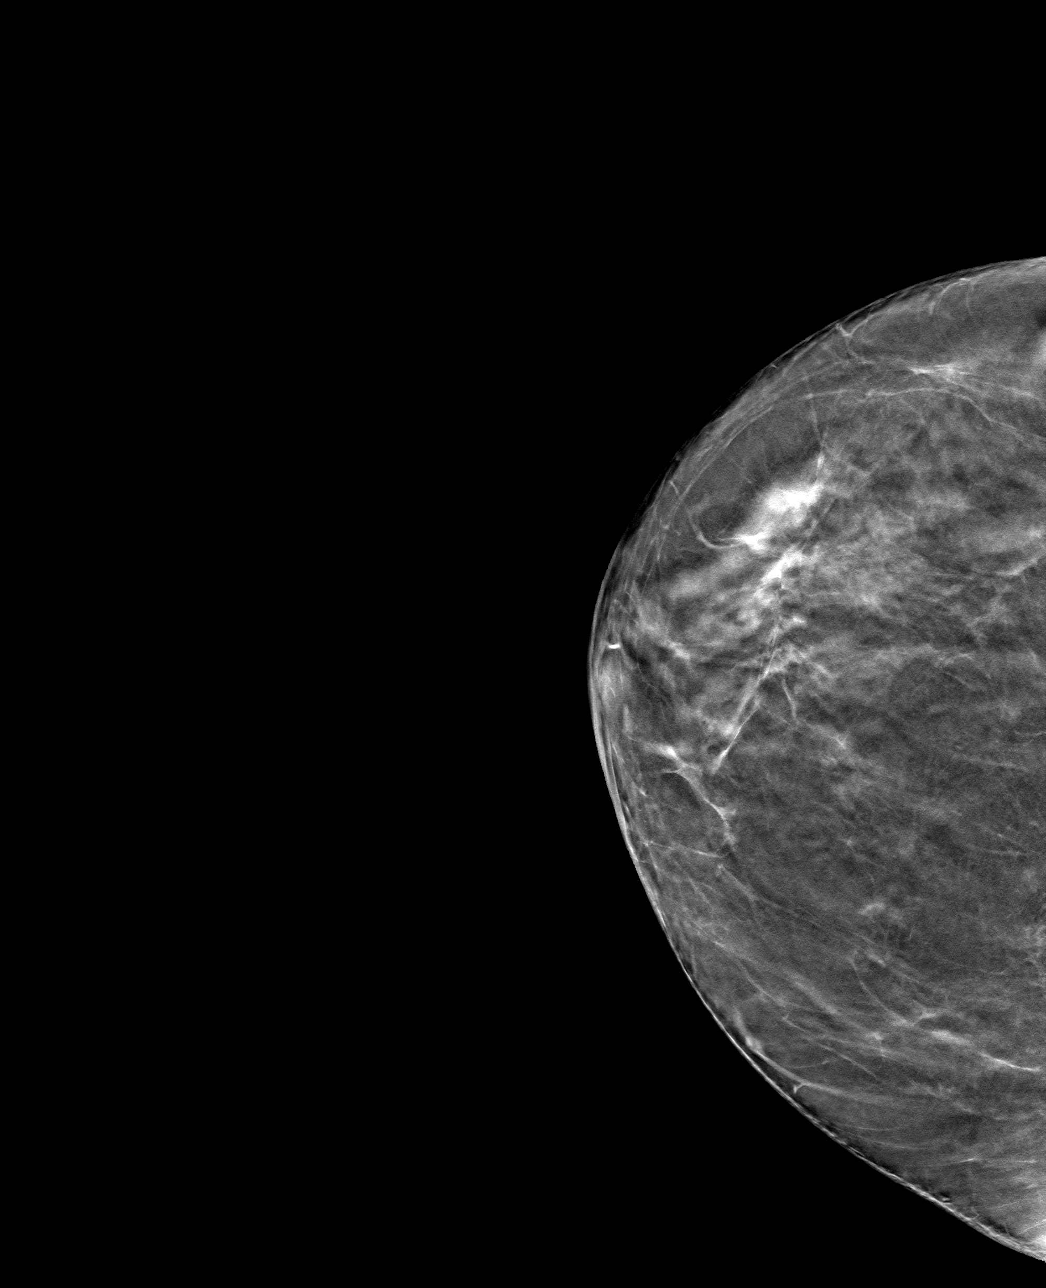

[4 of 12 positions shown; findings below may reference images not displayed]

ACR Breast Density Category c: The breast tissue is heterogeneously
dense, which may obscure small masses.
FINDINGS: The patient has had a left mastectomy. There are no findings
suspicious for malignancy.
IMPRESSION: No mammographic evidence of malignancy. A result letter of this
screening mammogram will be mailed directly to the patient.

RECOMMENDATION:
Screening mammogram in one year.  (Code:V6-Y-3H6)

BI-RADS CATEGORY  1: Negative.

## 2022-05-05 ENCOUNTER — Other Ambulatory Visit: Payer: Self-pay | Admitting: Oncology

## 2022-05-05 DIAGNOSIS — Z1231 Encounter for screening mammogram for malignant neoplasm of breast: Secondary | ICD-10-CM

## 2022-05-25 ENCOUNTER — Other Ambulatory Visit: Payer: Self-pay | Admitting: Physician Assistant

## 2022-05-25 DIAGNOSIS — E2839 Other primary ovarian failure: Secondary | ICD-10-CM

## 2022-05-27 ENCOUNTER — Other Ambulatory Visit: Payer: Medicare Other

## 2022-06-06 ENCOUNTER — Ambulatory Visit: Payer: Medicare Other

## 2022-06-14 ENCOUNTER — Ambulatory Visit
Admission: RE | Admit: 2022-06-14 | Discharge: 2022-06-14 | Disposition: A | Discharge status: Home / Self Care | Payer: Medicare Other | Source: Ambulatory Visit | Attending: Oncology | Admitting: Oncology

## 2022-06-14 DIAGNOSIS — Z1231 Encounter for screening mammogram for malignant neoplasm of breast: Secondary | ICD-10-CM

## 2022-07-21 ENCOUNTER — Ambulatory Visit
Admission: RE | Admit: 2022-07-21 | Discharge: 2022-07-21 | Disposition: A | Discharge status: Home / Self Care | Payer: Medicare Other | Source: Ambulatory Visit | Attending: Physician Assistant | Admitting: Physician Assistant

## 2022-07-21 DIAGNOSIS — E2839 Other primary ovarian failure: Secondary | ICD-10-CM

## 2023-09-12 ENCOUNTER — Other Ambulatory Visit: Payer: Self-pay | Admitting: Oncology

## 2023-09-12 DIAGNOSIS — Z1231 Encounter for screening mammogram for malignant neoplasm of breast: Secondary | ICD-10-CM

## 2023-10-09 ENCOUNTER — Ambulatory Visit
Admission: RE | Admit: 2023-10-09 | Discharge: 2023-10-09 | Disposition: A | Discharge status: Home / Self Care | Payer: Medicare HMO | Source: Ambulatory Visit | Attending: Oncology | Admitting: Oncology

## 2023-10-09 DIAGNOSIS — Z1231 Encounter for screening mammogram for malignant neoplasm of breast: Secondary | ICD-10-CM
